# Patient Record
Sex: Female | Born: 1975
Health system: Southern US, Community
[De-identification: ages and names within clinical notes are randomized; demographics above are authoritative.]

## PROBLEM LIST (undated history)

## (undated) DIAGNOSIS — E039 Hypothyroidism, unspecified: Secondary | ICD-10-CM

## (undated) DIAGNOSIS — E079 Disorder of thyroid, unspecified: Secondary | ICD-10-CM

## (undated) HISTORY — PX: BREAST BIOPSY: SHX20

---

## 2005-08-10 ENCOUNTER — Inpatient Hospital Stay (HOSPITAL_COMMUNITY): Admission: AD | Admit: 2005-08-10 | Discharge: 2005-08-10 | Payer: Self-pay | Admitting: Obstetrics and Gynecology

## 2006-08-24 ENCOUNTER — Ambulatory Visit: Payer: Self-pay | Admitting: Family Medicine

## 2006-08-27 ENCOUNTER — Emergency Department (HOSPITAL_COMMUNITY): Admission: EM | Admit: 2006-08-27 | Discharge: 2006-08-27 | Payer: Self-pay | Admitting: Emergency Medicine

## 2006-08-27 ENCOUNTER — Ambulatory Visit (HOSPITAL_COMMUNITY): Admission: RE | Admit: 2006-08-27 | Discharge: 2006-08-27 | Payer: Self-pay | Admitting: Family Medicine

## 2006-12-07 ENCOUNTER — Ambulatory Visit: Payer: Self-pay | Admitting: Family Medicine

## 2007-01-11 ENCOUNTER — Ambulatory Visit: Payer: Self-pay | Admitting: Family Medicine

## 2007-01-18 ENCOUNTER — Ambulatory Visit: Payer: Self-pay | Admitting: Family Medicine

## 2007-01-20 ENCOUNTER — Ambulatory Visit (HOSPITAL_COMMUNITY): Admission: RE | Admit: 2007-01-20 | Discharge: 2007-01-20 | Payer: Self-pay | Admitting: Family Medicine

## 2007-01-20 ENCOUNTER — Ambulatory Visit: Payer: Self-pay | Admitting: *Deleted

## 2007-02-11 ENCOUNTER — Ambulatory Visit: Payer: Self-pay | Admitting: Family Medicine

## 2007-02-11 ENCOUNTER — Encounter (INDEPENDENT_AMBULATORY_CARE_PROVIDER_SITE_OTHER): Payer: Self-pay | Admitting: Family Medicine

## 2007-07-13 ENCOUNTER — Encounter (INDEPENDENT_AMBULATORY_CARE_PROVIDER_SITE_OTHER): Payer: Self-pay | Admitting: *Deleted

## 2007-09-13 ENCOUNTER — Ambulatory Visit: Payer: Self-pay | Admitting: Family Medicine

## 2007-10-11 ENCOUNTER — Ambulatory Visit: Payer: Self-pay | Admitting: Family Medicine

## 2007-11-14 ENCOUNTER — Ambulatory Visit: Payer: Self-pay | Admitting: Family Medicine

## 2007-11-14 LAB — CONVERTED CEMR LAB
Free T4: 0.68 ng/dL — ABNORMAL LOW (ref 0.89–1.80)
TSH: 98.738 microintl units/mL — ABNORMAL HIGH (ref 0.350–5.50)

## 2007-12-13 ENCOUNTER — Ambulatory Visit: Payer: Self-pay | Admitting: Family Medicine

## 2007-12-26 ENCOUNTER — Ambulatory Visit (HOSPITAL_COMMUNITY): Admission: RE | Admit: 2007-12-26 | Discharge: 2007-12-26 | Payer: Self-pay | Admitting: Family Medicine

## 2008-03-12 ENCOUNTER — Encounter (INDEPENDENT_AMBULATORY_CARE_PROVIDER_SITE_OTHER): Payer: Self-pay | Admitting: Family Medicine

## 2008-03-12 ENCOUNTER — Ambulatory Visit: Payer: Self-pay | Admitting: Family Medicine

## 2008-03-12 LAB — CONVERTED CEMR LAB
BUN: 22 mg/dL (ref 6–23)
Basophils Absolute: 0.1 10*3/uL (ref 0.0–0.1)
CO2: 24 meq/L (ref 19–32)
Chlamydia, DNA Probe: NEGATIVE
Creatinine, Ser: 1.06 mg/dL (ref 0.40–1.20)
Eosinophils Absolute: 0.1 10*3/uL (ref 0.0–0.7)
MCV: 93.3 fL (ref 78.0–100.0)
Monocytes Absolute: 0.4 10*3/uL (ref 0.1–1.0)
Neutro Abs: 3.6 10*3/uL (ref 1.7–7.7)
Platelets: 234 10*3/uL (ref 150–400)
Potassium: 3.9 meq/L (ref 3.5–5.3)
RDW: 14.5 % (ref 11.5–15.5)
WBC: 6.8 10*3/uL (ref 4.0–10.5)

## 2008-08-28 ENCOUNTER — Emergency Department (HOSPITAL_COMMUNITY): Admission: EM | Admit: 2008-08-28 | Discharge: 2008-08-28 | Payer: Self-pay | Admitting: Emergency Medicine

## 2008-09-06 ENCOUNTER — Ambulatory Visit: Payer: Self-pay | Admitting: Family Medicine

## 2008-09-06 LAB — CONVERTED CEMR LAB
BUN: 25 mg/dL — ABNORMAL HIGH (ref 6–23)
Calcium: 9.6 mg/dL (ref 8.4–10.5)
Chloride: 102 meq/L (ref 96–112)
Free T4: 0.28 ng/dL — ABNORMAL LOW (ref 0.89–1.80)
Potassium: 4.6 meq/L (ref 3.5–5.3)
TSH: 622.881 microintl units/mL — ABNORMAL HIGH (ref 0.350–4.50)

## 2008-11-06 ENCOUNTER — Ambulatory Visit: Payer: Self-pay | Admitting: Family Medicine

## 2008-11-06 LAB — CONVERTED CEMR LAB
T3 Uptake Ratio: 27.7 % (ref 22.5–37.0)
T4, Total: 2.7 ug/dL — ABNORMAL LOW (ref 5.0–12.5)

## 2008-11-15 ENCOUNTER — Ambulatory Visit: Payer: Self-pay | Admitting: Family Medicine

## 2008-12-27 ENCOUNTER — Ambulatory Visit: Payer: Self-pay | Admitting: Family Medicine

## 2008-12-27 LAB — CONVERTED CEMR LAB: TSH: 508.509 microintl units/mL — ABNORMAL HIGH (ref 0.350–4.50)

## 2009-01-15 ENCOUNTER — Ambulatory Visit: Payer: Self-pay | Admitting: Family Medicine

## 2009-01-22 ENCOUNTER — Ambulatory Visit (HOSPITAL_COMMUNITY): Admission: RE | Admit: 2009-01-22 | Discharge: 2009-01-22 | Payer: Self-pay | Admitting: Family Medicine

## 2009-02-26 ENCOUNTER — Ambulatory Visit: Payer: Self-pay | Admitting: Family Medicine

## 2009-02-28 ENCOUNTER — Ambulatory Visit: Payer: Self-pay | Admitting: Family Medicine

## 2009-03-01 ENCOUNTER — Ambulatory Visit (HOSPITAL_COMMUNITY): Admission: RE | Admit: 2009-03-01 | Discharge: 2009-03-01 | Payer: Self-pay | Admitting: Family Medicine

## 2009-03-07 ENCOUNTER — Inpatient Hospital Stay (HOSPITAL_COMMUNITY): Admission: AD | Admit: 2009-03-07 | Discharge: 2009-03-07 | Payer: Self-pay | Admitting: Obstetrics & Gynecology

## 2009-03-09 ENCOUNTER — Inpatient Hospital Stay (HOSPITAL_COMMUNITY): Admission: AD | Admit: 2009-03-09 | Discharge: 2009-03-09 | Payer: Self-pay | Admitting: Obstetrics & Gynecology

## 2009-03-11 ENCOUNTER — Ambulatory Visit: Payer: Self-pay | Admitting: Family Medicine

## 2009-04-04 ENCOUNTER — Ambulatory Visit: Payer: Self-pay | Admitting: Obstetrics and Gynecology

## 2009-04-05 ENCOUNTER — Encounter: Payer: Self-pay | Admitting: Obstetrics & Gynecology

## 2009-08-05 IMAGING — US US TRANSVAGINAL NON-OB
1 series · 13 of 25 positions shown · non-contrast
Comparison: 12/26/2007

CLINICAL DATA: Positive pregnancy test 3 weeks ago with heavy
bleeding 2 weeks ago.  Question retained products of conception.

TRANSABDOMINAL AND TRANSVAGINAL ULTRASOUND OF PELVIS
TECHNIQUE: Both transabdominal and transvaginal ultrasound
examinations of the pelvis were performed including evaluation of
the uterus, ovaries, adnexal regions, and pelvic cul-de-sac.

[Series 1: us transvaginal non-ob · 0.30mm/px · 13 of 59 slices shown]
[im 1/59]
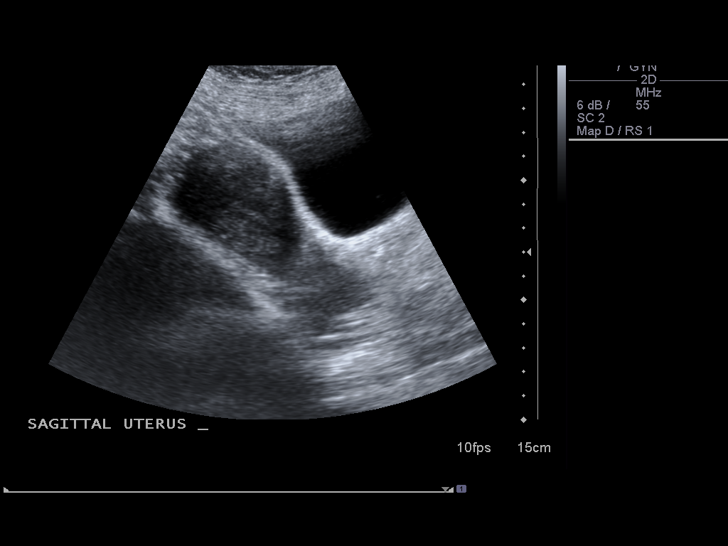
[im 5/59]
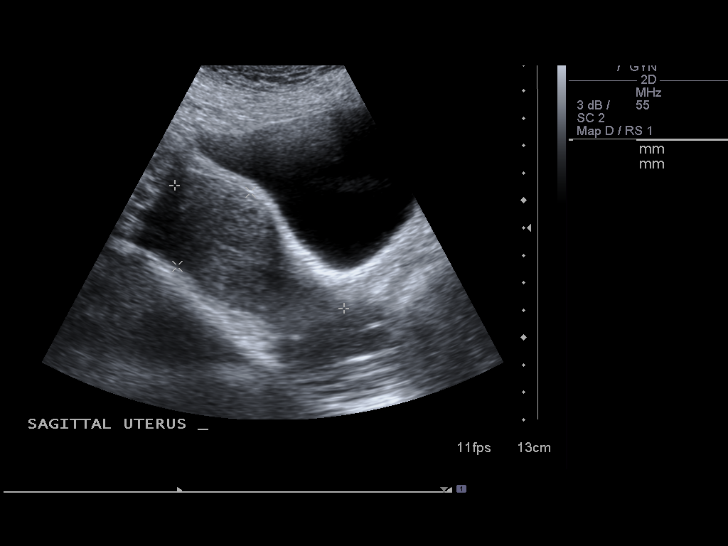
[im 10/59]
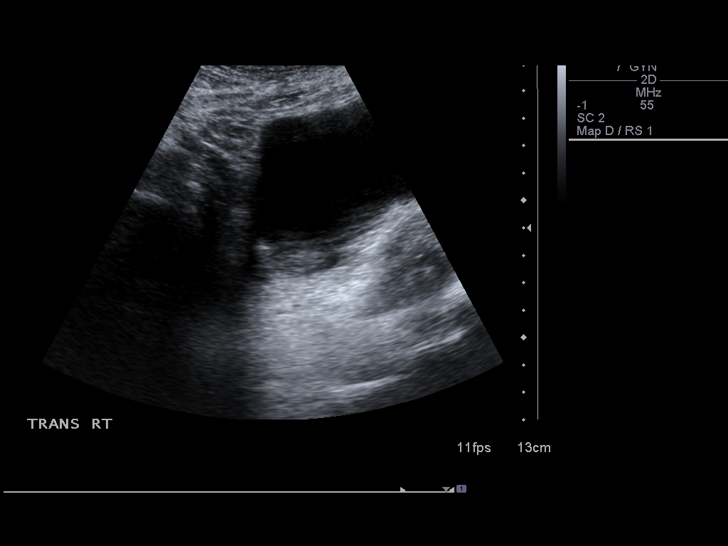
[im 15/59]
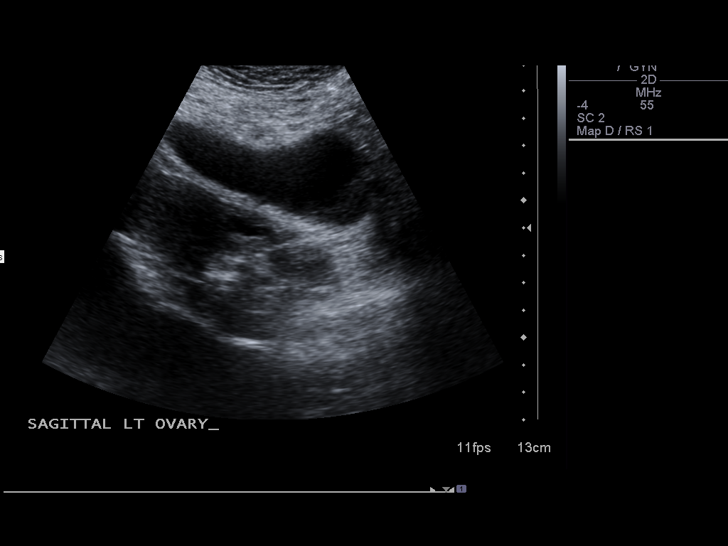
[im 20/59]
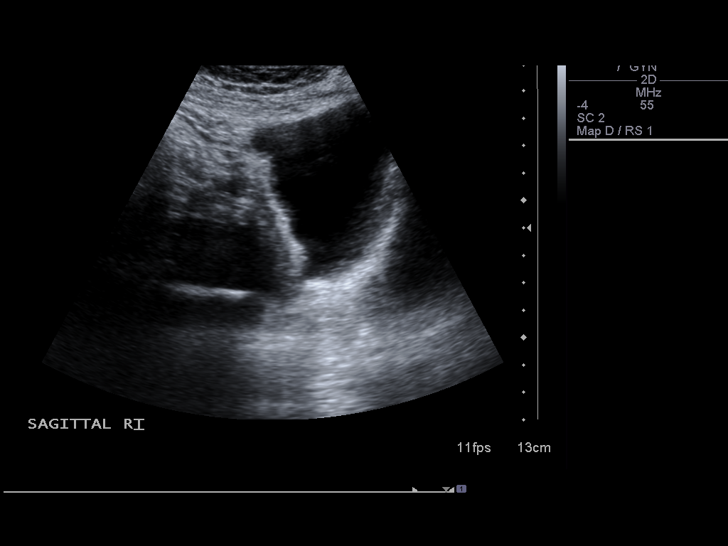
[im 25/59]
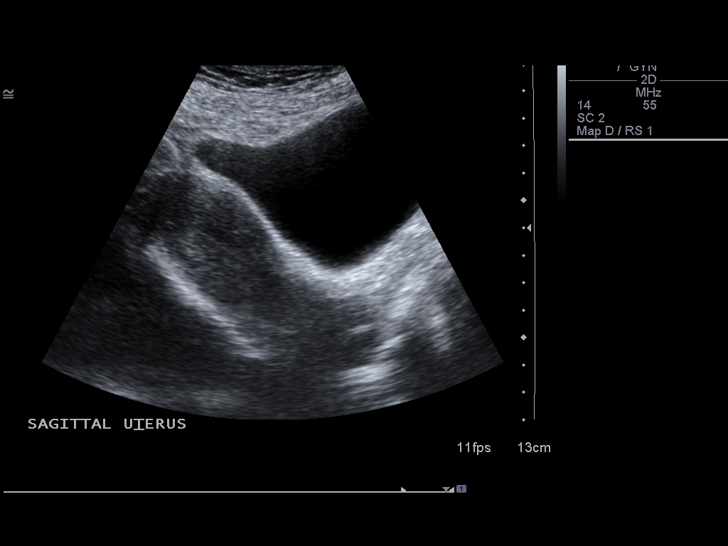
[im 30/59]
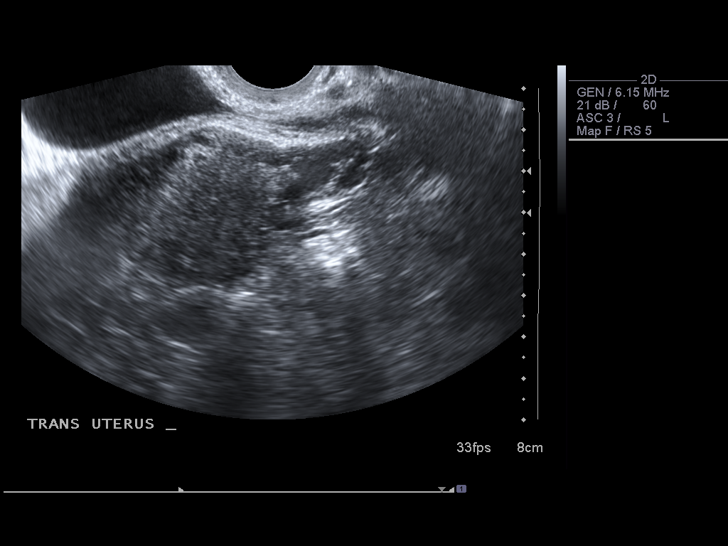
[im 34/59]
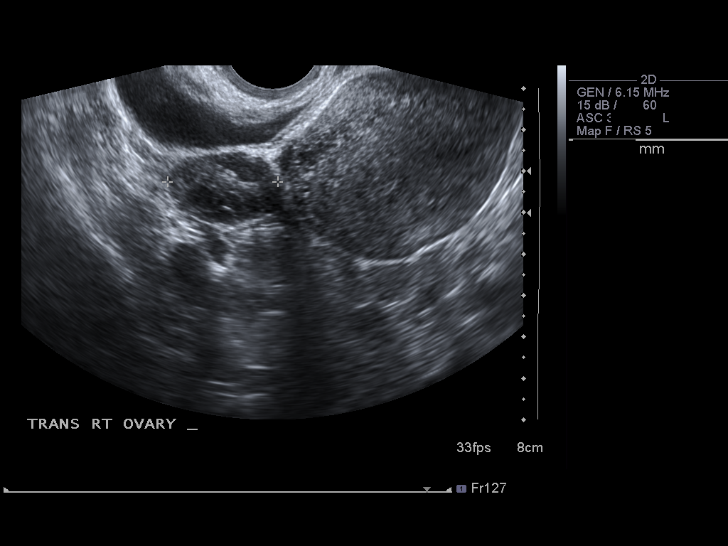
[im 39/59]
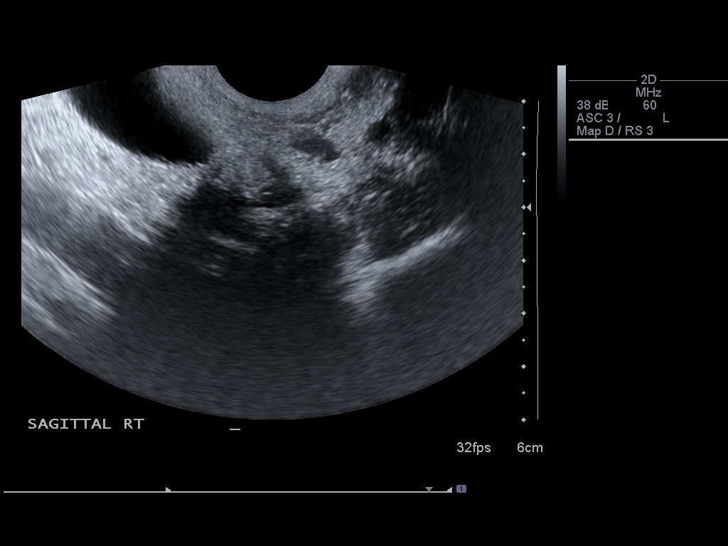
[im 44/59]
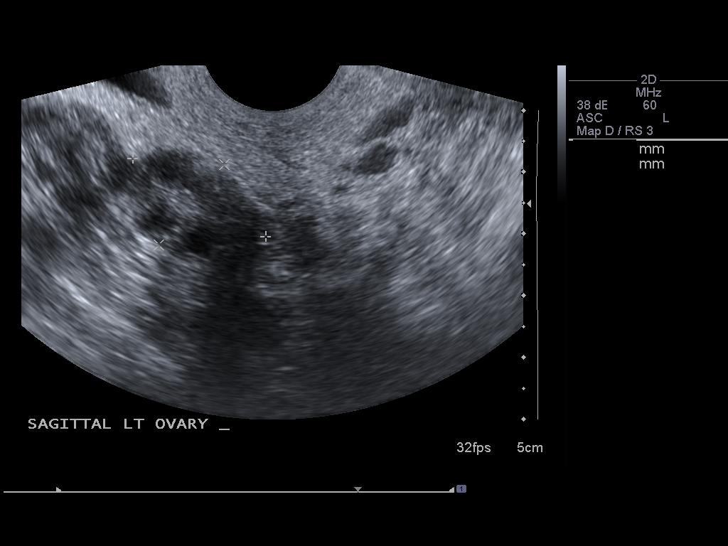
[im 49/59]
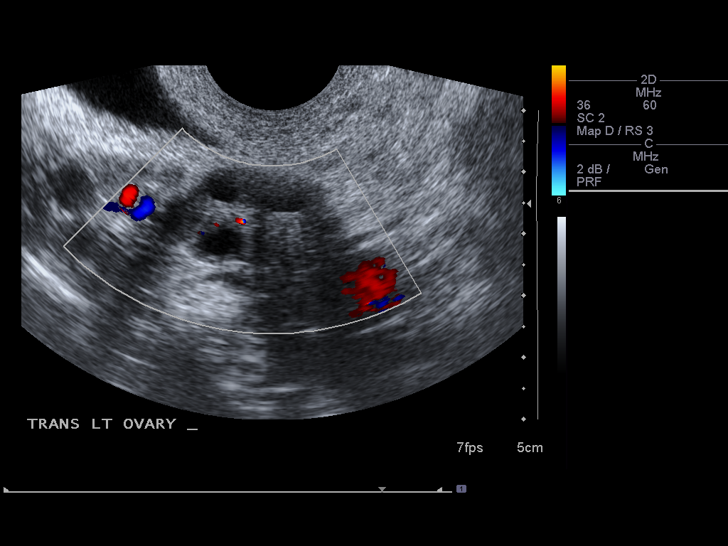
[im 54/59]
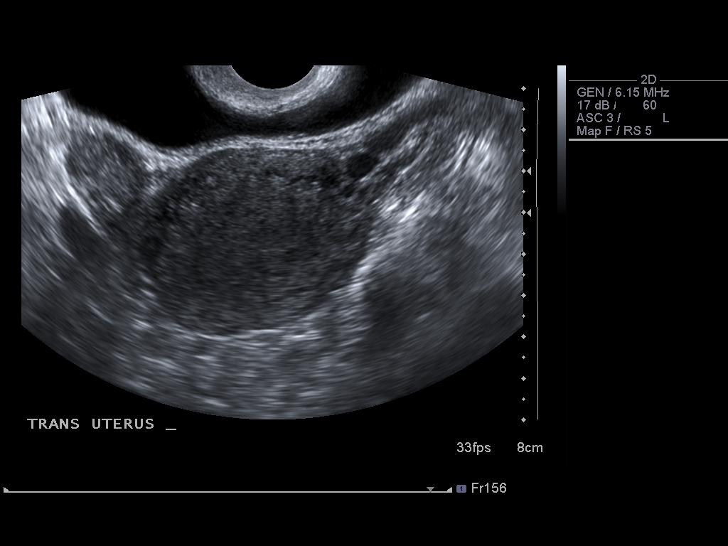
[im 59/59]
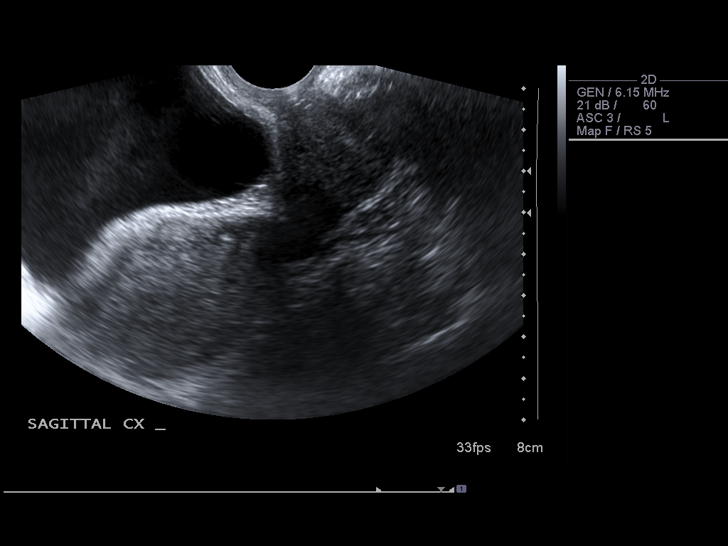

[13 of 25 positions shown; findings below may reference images not displayed]

FINDINGS: Uterus:  The uterus has a sagittal length of 7.6 cm, an AP width of
3.8 cm and a transverse width of 5.7 cm.  A homogeneous uterine
myometrium is seen.

Endometrium:  The endometrial stripe is thin and echogenic with an
AP width of 3.3 mm.  No areas of focal thickening or inhomogeneity
are seen to suggest retained products of conception.  No
endometrial thickening in general is noted to suggest that there
may be a persistent sonographically silent early IUP with
correlation with quantitative beta HCG would be necessary for this
determination.

Right Ovary:  Normal appearance measuring 2.8 x 1.6 x 2.7 cm

Left Ovary:  Normal appearance measuring 2.5 x 1.7 x 2.8 cm

Other Findings:  No pelvic fluid or separate adnexal masses are
noted.
IMPRESSION: Normal pelvic ultrasound with a thin endometrial lining.  No
sonographic signs to suggest retained products of conception are
seen.

## 2009-10-04 ENCOUNTER — Ambulatory Visit: Payer: Self-pay | Admitting: Family Medicine

## 2009-10-04 LAB — CONVERTED CEMR LAB
AST: 14 units/L (ref 0–37)
Alkaline Phosphatase: 42 units/L (ref 39–117)
BUN: 18 mg/dL (ref 6–23)
Calcium: 9 mg/dL (ref 8.4–10.5)
Cholesterol: 159 mg/dL (ref 0–200)
Creatinine, Ser: 0.66 mg/dL (ref 0.40–1.20)
Potassium: 4.8 meq/L (ref 3.5–5.3)
Sodium: 140 meq/L (ref 135–145)
TSH: 79.91 microintl units/mL — ABNORMAL HIGH (ref 0.350–4.500)
Total CHOL/HDL Ratio: 2.7
Total Protein: 7.2 g/dL (ref 6.0–8.3)

## 2009-10-14 ENCOUNTER — Ambulatory Visit (HOSPITAL_COMMUNITY): Admission: RE | Admit: 2009-10-14 | Discharge: 2009-10-14 | Payer: Self-pay | Admitting: Family Medicine

## 2010-03-13 ENCOUNTER — Ambulatory Visit: Payer: Self-pay | Admitting: Family Medicine

## 2010-09-05 ENCOUNTER — Encounter (INDEPENDENT_AMBULATORY_CARE_PROVIDER_SITE_OTHER): Payer: Self-pay | Admitting: Family Medicine

## 2010-09-05 LAB — CONVERTED CEMR LAB: CA 125: 17.9 units/mL (ref 0.0–30.2)

## 2010-11-16 ENCOUNTER — Encounter: Payer: Self-pay | Admitting: Family Medicine

## 2011-02-03 LAB — CBC
MCHC: 34.7 g/dL (ref 30.0–36.0)
MCV: 94.3 fL (ref 78.0–100.0)
RBC: 3.67 MIL/uL — ABNORMAL LOW (ref 3.87–5.11)
RDW: 13.4 % (ref 11.5–15.5)

## 2011-02-03 LAB — URINALYSIS, ROUTINE W REFLEX MICROSCOPIC
Bilirubin Urine: NEGATIVE
Specific Gravity, Urine: 1.01 (ref 1.005–1.030)
pH: 6 (ref 5.0–8.0)

## 2011-02-03 LAB — HCG, QUANTITATIVE, PREGNANCY: hCG, Beta Chain, Quant, S: 345 m[IU]/mL — ABNORMAL HIGH (ref <5)

## 2011-02-03 LAB — URINE MICROSCOPIC-ADD ON

## 2011-02-03 LAB — GC/CHLAMYDIA PROBE AMP, GENITAL: GC Probe Amp, Genital: NEGATIVE

## 2011-02-03 LAB — WET PREP, GENITAL

## 2011-03-10 NOTE — Group Therapy Note (Signed)
Brittney Pena, Brittney Pena NO.:  [id number]   MEDICAL RECORD NO.:  [id number]          PATIENT TYPE:  WOC   LOCATION:  [facility]                   FACILITY:  [facility]   PHYSICIAN:  Argentina Donovan, MD        DATE OF BIRTH:  June 25, 1976   DATE OF SERVICE:  04/04/2009                                  CLINIC NOTE   CHIEF COMPLAINT:  Followup of miscarriage at the [facility] 2 months prior.   HISTORY OF PRESENT ILLNESS:  This is a 35 year old G4, P2-0-2-2, who  presents for followup of missed abortion. She was seen at [facility] on Mar 07, 2009 and diagnosed with missed abortion.  Ultrasound at that time showed irregular uterine gestational sac. Beta  HCG level was 348. Two days later, followup beta HCG level had gone up  to 374. The patient declined Cytotec at that visit and was scheduled to  followup at [facility]. She states that she had been bleeding a small  amounts for a month and finished 2 weeks ago. She denies any abdominal  pain in the recent past. Denies any fever, chills, nausea, or vomiting.  She states she would desire a future pregnancy.   ALLERGIES:  NO KNOWN DRUG ALLERGIES.   MEDICATIONS:  1. Paxil.  2. Synthroid (thyroid medication.)   PAST SURGICAL HISTORY:  1. Cesarean section in 1999.   PAST MEDICAL HISTORY:  1. Depression.  2. Thyroid abnormality (might be hypothyroidism, for which I presume      she takes Synthroid.)   PHYSICAL EXAMINATION:  VITAL SIGNS:  Temperature 97.7, pulse 66,  respiratory rate 12, blood pressure 90/62. Weight 146.9 pounds.  GENERAL:  No acute distress. A Hispanic female, Spanish speaking only.  GENITOURINARY:  Deferred. There are no complaints today.   ASSESSMENT/PLAN:  This is a 35 year old G4, P2-0-2-2, who presents for  followup of missed abortion about a month ago.  1. MISSED ABORTION:  We will check a beta HCG quantity level today and      discuss with patient, options including Cytotec versus just logical      waiting. If beta HCG level is reassuring, she will likely need to      further followup. If beta HCG level is elevated or there are other      concerns, we will have patient come back for close followup and      possible weekly HCG checks versus Cytotec use.  2. CONTINUED DESIRE FERTILITY:  The patient states that she would like      to get pregnant again. Prenatal vitamin prescription given to      patient today. Also discussed trying to abstain from becoming      pregnant for the next 2 cycles and then she may resume as previous.  3. HYPOTHYROIDISM:  The patient states that her thyroid level was      checked by primary care physician a month ago and it was within      normal limits.  4. WELL WOMAN:  The patient states her last pap smear was 2 months ago  and she has had normal pap smears in the past.     ______________________________  Maylon Cos, CNM    ______________________________  Argentina Donovan, MD    SS/MEDQ  D:  04/04/2009  T:  04/04/2009  Job:  [id number]

## 2011-04-20 ENCOUNTER — Other Ambulatory Visit: Payer: Self-pay | Admitting: Geriatric Medicine

## 2011-04-20 DIAGNOSIS — N6452 Nipple discharge: Secondary | ICD-10-CM

## 2011-07-29 LAB — URINALYSIS, ROUTINE W REFLEX MICROSCOPIC
Glucose, UA: NEGATIVE
Ketones, ur: NEGATIVE
Leukocytes, UA: NEGATIVE
pH: 7.5

## 2011-07-29 LAB — URINE MICROSCOPIC-ADD ON

## 2011-07-29 LAB — DIFFERENTIAL
Basophils Relative: 1
Eosinophils Absolute: 0.1
Neutrophils Relative %: 54

## 2011-07-29 LAB — CBC
Hemoglobin: 12.2
MCHC: 34.4
RBC: 3.74 — ABNORMAL LOW

## 2011-07-29 LAB — COMPREHENSIVE METABOLIC PANEL
ALT: 32
Alkaline Phosphatase: 39
CO2: 28
GFR calc non Af Amer: >60
Glucose, Bld: 77
Potassium: 3.6
Sodium: 141

## 2011-07-29 LAB — LIPASE, BLOOD: Lipase: 30

## 2012-02-22 ENCOUNTER — Encounter (HOSPITAL_COMMUNITY): Payer: Self-pay | Admitting: Emergency Medicine

## 2012-02-22 ENCOUNTER — Emergency Department (HOSPITAL_COMMUNITY)
Admission: EM | Admit: 2012-02-22 | Discharge: 2012-02-22 | Discharge status: Against Medical Advice | Payer: Self-pay | Attending: Emergency Medicine | Admitting: Emergency Medicine

## 2012-02-22 DIAGNOSIS — Z0389 Encounter for observation for other suspected diseases and conditions ruled out: Secondary | ICD-10-CM | POA: Insufficient documentation

## 2012-02-22 HISTORY — DX: Disorder of thyroid, unspecified: E07.9

## 2012-02-22 NOTE — ED Notes (Signed)
Pt reports pain in right tooth, right sided numbness in facial/ sinus area;  Pt having headache as well.

## 2012-02-22 NOTE — ED Notes (Signed)
Pt arrived with R facial pain and numbness and R arm numbness.  Seen by NP Inocencio Homes and code stroke ruled out.

## 2014-07-26 ENCOUNTER — Ambulatory Visit (INDEPENDENT_AMBULATORY_CARE_PROVIDER_SITE_OTHER): Payer: No Typology Code available for payment source | Admitting: Emergency Medicine

## 2014-07-26 VITALS — BP 94/70 | HR 76 | Temp 97.7°F | Resp 16

## 2014-07-26 DIAGNOSIS — T2692XA Corrosion of left eye and adnexa, part unspecified, initial encounter: Secondary | ICD-10-CM

## 2014-07-26 MED ORDER — ACETAMINOPHEN-CODEINE #3 300-30 MG PO TABS: 1.0000 | ORAL_TABLET | ORAL | Status: DC | PRN

## 2014-07-26 MED ORDER — OFLOXACIN 0.3 % OP SOLN: 1.0000 [drp] | OPHTHALMIC | Status: DC

## 2014-07-26 NOTE — Progress Notes (Signed)
[facility] [address], [place] [address] [phone]  Date:  07/26/2014   Name:  Brittney Pena   DOB:  1976-08-01   MRN:  [id number]  PCP:  No primary provider on file.    Chief Complaint: No chief complaint on file.   History of Present Illness:  Brittney Pena is a 38 y.o. very pleasant female patient who presents with the following:  Patient splashed clorox in her left eye.  Washed with water.  Still has pain in left eye Spasm of lids. Denies other complaint or health concern today.   There are no active problems to display for this patient.   Past Medical History  Diagnosis Date  . Thyroid disease     No past surgical history on file.  History  Substance Use Topics  . Smoking status: Not on file  . Smokeless tobacco: Not on file  . Alcohol Use:     No family history on file.  No Known Allergies  Medication list has been reviewed and updated.  Current Outpatient Prescriptions on File Prior to Visit  Medication Sig Dispense Refill  . levothyroxine (SYNTHROID, LEVOTHROID) 100 MCG tablet Take 100 mcg by mouth daily.       No current facility-administered medications on file prior to visit.    Review of Systems:  As per HPI, otherwise negative.    Physical Examination: There were no vitals filed for this visit. There were no vitals filed for this visit. There is no height or weight on file to calculate BMI. Ideal Body Weight:     GEN: WDWN, moderate distress, Non-toxic, Alert & Oriented x 3 HEENT: Atraumatic, Normocephalic.  Ears and Nose: No external deformity. EXTR: No clubbing/cyanosis/edema NEURO: Normal gait.  PSYCH: Normally interactive. Conversant. Not depressed or anxious appearing.  Calm demeanor.  Left eye sclera injected and red.  No FB.  PRRERLA EOMI Negative fluorescein  Assessment and Plan: Chemical conjunctivitis Irrigated 2 liters NSS Final pH:  7.0 tyl #3 oflox Ophthalmology    Signed,   Ellison Carwin, MD

## 2014-07-26 NOTE — Addendum Note (Signed)
Addended by: Jacqualin Combes on: 07/26/2014 11:11 AM   Modules accepted: Level of Service

## 2014-07-26 NOTE — Patient Instructions (Signed)
Conjuntivitis por sustancias qumicas (Chemical Conjunctivitis) El profesional que lo asiste le ha diagnosticado que usted padece conjuntivitis producida por sustancias qumicas. Se trata de una irritacin de la zona interna del prpado y de la parte blanca del ojo. Las causas de la conjuntivitis pueden ser una infeccin, [place] o irritacin qumica. En su caso, el origen es una irritacin producida por un producto qumico. Casi siempre se asocia con lagrimeo, sensibilidad a la luz, sensacin (percepcin) de Geneticist, molecular ojo, hinchazn de los prpados, y con Glass blower/designer intenso. A pesar del dolor, se autolimita y podr mejorar en veinticuatro horas.  INSTRUCCIONES PARA EL CUIDADO DOMICILIARIO  Para aliviar la molestia, aplique una compresa limpia y fra en el ojo durante 10 a 20 minutos, 3 a 4 veces por da.  No se frote los ojos.  Limpie suavemente todas las secreciones del ojo con un tis hmedo.  Lvese las manos con frecuencia con jabn y use toallas de papel para secarse.  Puede utilizar anteojos para el sol si la [employer].  No utilice maquillaje.  No utilice lentes de contacto hasta que la irritacin haya desaparecido.  No opere maquinarias ni conduzca si su visin es borrosa.  Tome los [employer] como le indic el profesional que lo asiste. Las lgrimas artificiales pueden causar molestias.  Evite las sustancias qumicas o los ambientes que le causaron el problema. Use proteccin en los ojos siempre que sea necesario. SOLICITE ATENCIN MDICA SI:  Si los ojos an estn irritados (inflamados) luego de 3 das de haber comenzado el tratamiento.  El [employer] ojos Wanda.  Usted presenta una secrecin que proviene de un ojo.  Sus prpados estn pegados por la [employer]  Tiene una mayor sensibilidad a Naval architect.  La temperatura oral se eleva sin motivo por encima de 38,9 C (13 F) o segn le indique el profesional que lo asiste.  Comienza a Psychiatrist.  Tiene algn problema que pueda relacionarse con el medicamento que est tomando. SOLICITE ATENCIN MDICA DE INMEDIATO SI:  La visin es empeora.  Presenta dolor intenso en el ojo. EST SEGURO QUE:   Comprende las instrucciones para el alta mdica.  Controlar su enfermedad.  Solicitar atencin mdica de inmediato segn las indicaciones. Document Released: 07/22/2005 Document Revised: 01/04/2012 [facility] Patient Information 2015 [place]. This information is not intended to replace advice given to you by your health care provider. Make sure you discuss any questions you have with your health care provider.

## 2015-03-16 ENCOUNTER — Ambulatory Visit (INDEPENDENT_AMBULATORY_CARE_PROVIDER_SITE_OTHER): Payer: 59 | Admitting: Family Medicine

## 2015-03-16 VITALS — BP 90/60 | HR 69 | Temp 98.6°F | Resp 16 | Ht 65.0 in | Wt 144.1 lb

## 2015-03-16 DIAGNOSIS — M542 Cervicalgia: Secondary | ICD-10-CM

## 2015-03-16 DIAGNOSIS — G8929 Other chronic pain: Secondary | ICD-10-CM

## 2015-03-16 DIAGNOSIS — N920 Excessive and frequent menstruation with regular cycle: Secondary | ICD-10-CM

## 2015-03-16 DIAGNOSIS — M545 Low back pain, unspecified: Secondary | ICD-10-CM

## 2015-03-16 MED ORDER — MELOXICAM 7.5 MG PO TABS: 7.5000 mg | ORAL_TABLET | Freq: Every day | ORAL | Status: DC

## 2015-03-16 NOTE — Progress Notes (Addendum)
° °  Subjective:  This chart was scribed for Brittney Haber, MD by Leandra Kern, Medical Scribe. This patient was seen in Room 8 and the patient's care was started at 2:33 PM.   Patient ID: Brittney Pena, female    DOB: 1976/10/16, 39 y.o.   MRN: [id number]  HPI HPI Comments: Brittney Pena is a 39 y.o. female from [place] presents to [facility] complaining of neck and back pain.  Pt reports feeling tightness in the neck, gradual onset of 6 months. Patient also notes having lumbar back pain.  She states that she feels nauseated when eating, however, without vomiting. Patient denies having coughs.  Patient's last menstrual period was from 05/12 -05/18 and it was  accompanied with passing clots, it was more painful than normal. Patient has 2 children, one caesarian, and one natural.  Patient's last Pap smear was a year ago.   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   Review of Systems  Respiratory: Negative for cough.   Gastrointestinal: Positive for nausea. Negative for vomiting.  Genitourinary: Positive for menstrual problem.  Musculoskeletal: Positive for back pain, neck pain and neck stiffness.      Objective:   Physical Exam  Constitutional: She is oriented to person, place, and time. She appears well-developed and well-nourished. No distress.  HENT:  Head: Normocephalic and atraumatic.  Mouth/Throat: Oropharynx is clear and moist. No oropharyngeal exudate.  Eyes: Pupils are equal, round, and reactive to light.  Neck: Neck supple.  Cardiovascular: Normal rate.   Pulmonary/Chest: Effort normal.  Abdominal: Soft. She exhibits no distension and no mass. There is tenderness. There is no rebound and no guarding.  Bilateral lower abdominal tenderness with deep palpation, no guarding or rebound  Genitourinary: Vagina normal.  Tender uterus on bimanual exam Normal external genitalia him a normal vagina, normal cervix  Musculoskeletal: She exhibits no edema.   Neurological: She is alert and oriented to person, place, and time. No cranial nerve deficit.  Skin: Skin is warm and dry. No rash noted.  Psychiatric: She has a normal mood and affect. Her behavior is normal.  Nursing note and vitals reviewed.     Assessment & Plan:   This chart was scribed in my presence and reviewed by me personally.    ICD-9-CM ICD-10-CM   1. Neck pain 723.1 M54.2 meloxicam (MOBIC) 7.5 MG tablet  2. Menorrhagia with regular cycle 626.2 N92.0 Ambulatory referral to Gynecology  3. Chronic low back pain 724.2 M54.5    338.29 G89.29    Because of the tenderness of the uterus which is mildly enlarged, I think it's important to have the patient follow-up with gynecology. What appears to be fibroids could be contributing to the lower back pain.  I don't see any indication that the neck pain is anything serious. She has good range of motion, reflexes are normal, and her strength and muscle mass are normal in the upper extremities. The fact that has gone on for 6 months without change also speaks to the benign nature. I've given her some meloxicam to help deal with the chronic pain and muscle fatigue that she endorse with her job.  Signed, Brittney Haber, MD

## 2015-03-16 NOTE — Patient Instructions (Signed)
Fibromas (Fibroids) Los fibromas son bultos (tumores) que pueden Administrator del cuerpo de Musician. Estos tumores no son cancerosos. Pueden variar en tamao, peso y lugar en el que crecen. CUIDADOS EN EL HOGAR  No tome aspirina.  Anote el nmero de apsitos o tampones que [place] durante el perodo. Infrmelo a su mdico. Esto puede ayudar a determinar el mejor tratamiento para usted. SOLICITE AYUDA DE INMEDIATO SI:  Siente dolor en la zona inferior del vientre (abdomen) y no se alivia con analgsicos.  Tiene clicos que no se calman con medicamentos  Aumenta el sangrado entre perodos o durante el mismo.  Sufre mareos o se desvanece (se desmaya).  El dolor en el vientre [place]. ASEGRESE DE QUE:  Comprende estas instrucciones.  Controlar su enfermedad.  Solicitar ayuda de inmediato si no mejora o empeora. Document Released: 01/27/2011 Document Revised: 01/04/2012 [facility] Patient Information 2015 [place]. This information is not intended to replace advice given to you by your health care provider. Make sure you discuss any questions you have with your health care provider.

## 2015-04-11 ENCOUNTER — Other Ambulatory Visit (HOSPITAL_COMMUNITY)
Admission: RE | Admit: 2015-04-11 | Discharge: 2015-04-11 | Disposition: A | Discharge status: Home / Self Care | Payer: 59 | Source: Ambulatory Visit | Attending: Gynecology | Admitting: Gynecology

## 2015-04-11 ENCOUNTER — Encounter: Payer: Self-pay | Admitting: Gynecology

## 2015-04-11 ENCOUNTER — Ambulatory Visit (INDEPENDENT_AMBULATORY_CARE_PROVIDER_SITE_OTHER): Payer: 59 | Admitting: Gynecology

## 2015-04-11 VITALS — BP 118/76 | Ht 65.0 in | Wt 142.6 lb

## 2015-04-11 DIAGNOSIS — Z01419 Encounter for gynecological examination (general) (routine) without abnormal findings: Secondary | ICD-10-CM | POA: Insufficient documentation

## 2015-04-11 DIAGNOSIS — N938 Other specified abnormal uterine and vaginal bleeding: Secondary | ICD-10-CM | POA: Insufficient documentation

## 2015-04-11 DIAGNOSIS — G44009 Cluster headache syndrome, unspecified, not intractable: Secondary | ICD-10-CM

## 2015-04-11 DIAGNOSIS — Z1151 Encounter for screening for human papillomavirus (HPV): Secondary | ICD-10-CM | POA: Diagnosis present

## 2015-04-11 DIAGNOSIS — E038 Other specified hypothyroidism: Secondary | ICD-10-CM | POA: Diagnosis not present

## 2015-04-11 DIAGNOSIS — O926 Galactorrhea: Secondary | ICD-10-CM | POA: Diagnosis not present

## 2015-04-11 DIAGNOSIS — Z124 Encounter for screening for malignant neoplasm of cervix: Secondary | ICD-10-CM

## 2015-04-11 DIAGNOSIS — N643 Galactorrhea not associated with childbirth: Secondary | ICD-10-CM | POA: Insufficient documentation

## 2015-04-11 NOTE — Addendum Note (Signed)
Addended by: Alen Blew on: 04/11/2015 12:34 PM   Modules accepted: Orders

## 2015-04-11 NOTE — Patient Instructions (Signed)
Levonorgestrel intrauterine device (IUD) Qu es este medicamento? El LEVONORGESTREL (DIU) es un dispositivo anticonceptivo (control de natalidad). El dispositivo se coloca dentro del tero por un profesional de la salud. Se utiliza para Therapist, occupational y tambin se puede Risk manager para tratar el sangrado abundante que ocurre durante su perodo. Dependiendo del dispositivo, se puede utilizar por 3 a 5 aos. Este medicamento puede ser utilizado para otros usos; si tiene alguna pregunta consulte con su proveedor de atencin mdica o con su farmacutico. MARCAS COMERCIALES DISPONIBLES: Jackson Latino, Hubbard Hartshorn le debo informar a mi profesional de la salud antes de tomar este medicamento? Necesita saber si usted presenta alguno de los siguientes problemas o situaciones: -exmen de Papanicolaou anormal -cncer de mama, cuello del tero o tero -diabetes -endometritis -si tiene una infeccin plvica o genital actual o en el pasado -tiene ms de una pareja sexual o si su pareja tiene ms de una pareja -enfermedad cardiaca -antecedente de embarazo tubrico o ectpico -problemas del sistema inmunolgico -DIU colocado -enfermedad heptica o tumor del hgado -problemas con la coagulacin o si toma diluyentes sanguneos -[place] medicamentos intravenoso -forma inusual del tero -sangrado vaginal que no tiene explicacin -una reaccin alrgica o inusual al levonorgestrel, a otras hormonas, a la silicona o polietilenos, a otros medicamentos, alimentos, colorantes o conservantes -si est embarazada o buscando quedar embarazada -si est amamantando a un beb Cmo debo utilizar este medicamento? Un profesional de Estate agent este dispositivo en el tero. Hable con su pediatra para informarse acerca del uso de este medicamento en nios. Puede requerir atencin especial. Sobredosis: Pngase en contacto inmediatamente con un centro toxicolgico o una sala de urgencia si usted cree que haya tomado  demasiado medicamento. ATENCIN: [employer] es solo para usted. No comparta este medicamento con nadie. Qu sucede si me olvido de una dosis? No se aplica en este caso. Qu puede interactuar con este medicamento? No tome esta medicina con ninguno de los siguientes medicamentos: -amprenavir -bosentano -fosamprenavir Esta medicina tambin puede interactuar con los siguientes medicamentos: -aprepitant -barbitricos para producir el sueo o para el tratamiento de convulsiones -bexaroteno -griseofulvina -medicamentos para tratar los convulsiones, tales como [place], [place], [place], [place], [place], topiramato -modafinilo -pioglitazona -rifabutina -rifampicina -rifapentina -algunos medicamentos para tratar el virus VIH, tales como atazanavir, indinavir, lopinavir, nelfinavir, tipranavir, ritonavir -hierba de [place] -warfarina Puede ser que esta lista no menciona todas las posibles interacciones. Informe a su profesional de [employer]	[place] de [employer] productos a base de hierbas, medicamentos de [place] o suplementos nutritivos que est tomando. Si usted fuma, consume bebidas alcohlicas o si utiliza drogas ilegales, indqueselo tambin a su profesional de [employer]	[place]. Algunas sustancias pueden interactuar con su medicamento. A qu debo estar atento al usar [employer]? Visite a su mdico o a su profesional de la salud para chequear su evolucin peridicamente. Visite a su mdico si usted o su pareja tiene relaciones sexuales con [employer], se vuelve VIH positivo o contrae una enfermedad de transmisin sexual. Este medicamento no la protege de la infeccin por VIH (SIDA) ni de ninguna otra enfermedad de transmisin sexual. Puede controlar la ubicacin del DIU usted misma palpando con sus dedos limpios los hilos en la parte anterior de la vagina. No tire de los hilos. Es un buen hbito controlar la ubicacin del dispositivo despus de cada perodo menstrual. Si  no slo siente los hilos sino que adems siente otra parte ms del DIU o si no puede sentir los hilos, consulte a Visual merchandiser  mdico inmediatamente. El DIU puede salirse por s solo. Puede quedar embarazada si el dispositivo se sale de Chief of Staff. Utilice un mtodo anticonceptivo adicional, como preservativos, y consulte a su proveedor de atencin mdica s observa que el DIU se sali de Chief of Staff. La utilizacin de tampones no cambia la posicin del DIU y no hay inconvenientes en usarlos durante su perodo. Qu efectos secundarios puedo tener al [employer] este medicamento? Efectos secundarios que debe informar a su mdico o a Barrister's clerk de la salud tan pronto como sea posible: -Chief of Staff como erupcin cutnea, picazn o urticarias, hinchazn de la cara, labios o lengua -fiebre, sntomas gripales -llagas genitales -alta presin sangunea -ausencia de un perodo menstrual durante 6 semanas mientras lo utiliza -Social research officer, government, Occupational hygienist en las piernas -dolor o sensibilidad del plvico -dolor de cabeza repentino o severo -signos de Media planner -calambres estomacales -falta de aliento repentina -problemas de coordinacin, del habla, al caminar -sangrado, flujo vaginal inusual -color amarillento de los ojos o la piel Efectos secundarios que, por lo general, no requieren atencin mdica (debe informarlos a su mdico o a su profesional de la salud si persisten o si son molestos): -acn -dolor de pecho -cambios en el deseo sexual o capacidad -cambios de peso -calambres, Tree surgeon o sensacin de [employer] se introduce el dispositivo -dolor de cabeza -sangrado menstruales irregulares en los primeros 3 a 6 meses de usar -nuseas Puede ser que esta lista no menciona todos los posibles efectos secundarios. Comunquese a su mdico por asesoramiento mdico [employer]. Usted puede informar los efectos secundarios a la FDA por telfono al 1-800-FDA-1088. Dnde debo guardar mi  medicina? No se aplica en este caso. ATENCIN: Este folleto es un resumen. Puede ser que no cubra toda la posible informacin. Si usted tiene preguntas acerca de esta medicina, consulte con su mdico, su farmacutico o su profesional de Technical sales engineer.  2015, Elsevier/Gold Standard. (2011-12-01 16:57:41) Ecografa transvaginal (Transvaginal Ultrasound) La ecografa transvaginal es una ecografa plvica en la que se utiliza una probeta metlica que se coloca en la vagina, para observar los rganos femeninos. El ecgrafo enva ondas sonoras desde un transductor (sonda). Estas ondas sonoras chocan contra las estructuras del cuerpo (como un eco) y crean Proofreader. La imagen se observa en un monitor. Se denomina transvaginal debido a que la sonda se inserta dentro de la vagina. Puede haber una pequea molestia por la introduccin de la sonda. Esta prueba tambin puede realizarse [employer]. La ecografa endovaginal es otro nombre para la ecografa transvaginal. En una ecografa transabdominal, la sonda se coloca en la parte externa del abdomen. Este mtodo no ofrece imgenes tan buenas como la tcnica transvaginal. La ecogafa transvaginal se utiliza para observar alteraciones en el tracto genital femenino. Entre ellos se incluyen:  Problemas de infertilidad.  Malformaciones congnitas (defecto de nacimiento) del tero y los ovarios.  Tumores en el tero.  Hemorragias anormales.  Tumores y quistes de ovario.  Abscesos (tejidos inflamados y pus) en la pelvis.  Dolor abdominal o plvico sin causa aparente.  Infecciones plvicas. DURANTE EL EMBARAZO, SE UTILIZA PAR OBSERVAR:  Embarazos normales.  Un embarazo ectpico (embarazo fuera del tero).  Latidos cardacos fetales.  Anormalidades de la pelvis que no se observan bien con la ecografa transabdominal.  Sospecha de gemelos o embarazo mltiple.  Aborto inminente.  Problemas en el cuello del tero (cuello incompetente, no  permanece cerrado para contener al beb).  Cuando se realiza una amniocentesis (se retira lquido de  la bolsa amnitica, para ser [place]).  Al buscar anormalidades en el beb.  Para controlar el crecimiento, el desarrollo y la edad del feto.  Para medir la cantidad de lquido en el saco amnitico.  Cuando se realiza una versin externa del beb (se lo mueve a Programmer, applications).  Evaluar al beb en embarazos de alto riesgo (perfil biofsico).  Si se sospecha el deceso del beb (muerte). En algunos casos, se utiliza un mtodo especial denominado ecografa con infusin salina, para una observacin ms precisa del tero. Se inyecta solucin salina (agua con sal) dentro del tero en pacientes no embarazadas para observar mejor su interior. Este mtodo no se Designer, fashion/clothing. La probeta tambin puede usarse para obtener biopsias de Educational psychologist, para drenar lquido de quistes de ovario y para Designer, jewellery un DIU (dispositivo intrauterino para el control de la natalidad) que no pueda [place]. PREPARACIN PARA LA PRUEBA La ecografa transvaginal se realiza con la vejiga vaca. La ecografa transabdominal se realiza con la vejiga llena. Podrn solicitarle que beba varios vasos de agua antes del examen. En algunos casos se realiza una ecografa transabdominal antes de la ecografa transvaginal para obervar los rganos del abdomen. PROCEDIMIENTO  Deber acostarse en una cama, con las rodillas dobladas y los pies en los estribos. La probeta se cubre con un condn. Dentro de la vagina y en la probeta se aplica un lubricante estril. El lubricante ayuda a transmitir las ondas sonoras y Fish farm manager la irritacin de la vagina. El mdico mover la sonda en el interior de la cavidad vaginal para escanear las estructuras plvicas. Un examen normal mostrar una pelvis normal y contenidos normales en su interior. Una prueba anormal mostrar anormalidades en la pelvis, la placenta o el beb. LAS CAUSAS DE  UN RESULTADO ANORMAL PUEDEN SER:  Crecimientos o tumores en:  El tero.  Los ovarios.  La vagina.  Otras estructuras plvicas.  Crecimientos no cancerosos en el tero y los ovarios.  El ovario se retuerce y se corta el suministro de Carma [place]air (torsin [place]).  Las reas de infeccin incluyen:  Enfermedad inflamatoria plvica.  Un absceso en la pelvis.  Ubicacin de un DIU. LOS PROBLEMAS QUE PUEDEN HALLARSE EN UNA MUJER EMBARAZADA SON:  Embarazo ectpico (embarazo fuera del tero).  Embarazos mltiples.  Dilatacin (apertura) precoz anormal del cuello del tero. Esto puede indicar un cuello incompetente y Biomedical scientist.  Aborto inminente.  Muerte fetal.  Los problemas con la placenta incluyen:  La placenta se ha desarrollado sobre la abertura del cuello del tero (placenta previa).  La placenta se ha separado anticipadamente en el tero (abrupcin placentaria).  La placenta se desarrolla en el msculo del tero (placenta acreta).  Tumores del Media planner, incluyendo la enfermedad trofoblstica gestacional. Se trata de un embarazo anormal en el que no hay feto. El tero se llena de quistes similares a uvas que en algunos casos son cancerosos.  Posicin incorrecta del feto (de nalgas, de vrtice).  Retraso del desarrollo fetal intrauterino (escaso desarrollo en el tero).  Anormalidades o infeccin fetal. RIESGOS Y COMPLICACIONES No hay riesgos conocidos para la ecografa. No se toman radiografas cuando se realiza una ecografa. Document Released: 01/28/2009 Document Revised: 01/04/2012 [facility] Patient Information 2015 [place]. This information is not intended to replace advice given to you by your health care provider. Make sure you discuss any questions you have with your health care provider.

## 2015-04-11 NOTE — Addendum Note (Signed)
Addended by: Terrance Mass on: 04/11/2015 12:33 PM   Modules accepted: Level of Service

## 2015-04-11 NOTE — Progress Notes (Signed)
   Patient's a 39 year old gravida 3 para 2 (1 normal spontaneous vaginal delivery and one cesarean section) with 1 spontaneous AB. Patient presented to the office complaining of dysmenorrhea and menorrhagia for several months now. Patient was informed the past that she may have fibroid uterus. She has not been using any form of contraception. She has stated that she has suffer from headaches quite frequently and had some nipple discharge but no uncertain about double vision. Patient denies any past history of any abnormal Pap smear. She has not had her thyroid tested and quite some time she does have hypothyroidism. Patient uncertain whether many years ago she has some form of STD when she was younger.  Exam: Blood pressure 118/76 weight 142 pounds height 5 feet 5 inches tall BMI 23.73 Gen. appearance well-developed nourished female in no acute distress Abdomen: Soft nontender no rebound or guarding Pelvic: Bartholin urethra Skene was within normal limits Vagina: No lesions or discharge Cervix no lesions or discharge Uterus: Anteverted normal size shape and consistency Adnexa: No palpable mass or tenderness Rectal exam: Not done  Assessment/plan: #1 patient with history of dysfunction uterine bleeding will return back to the office next week for sonohysterogram and endometrial biopsy. Patient currently not menstruating. We will also check her CBC. #2 because of galactorrhea and headaches were going to check her prolactin level #3 because of history of hypothyroidism and no recent blood work a TSH will be done todBecause  #4 Pap smear done today.

## 2015-04-15 LAB — CYTOLOGY - PAP

## 2015-05-06 ENCOUNTER — Other Ambulatory Visit: Payer: Self-pay | Admitting: Gynecology

## 2015-05-06 DIAGNOSIS — N939 Abnormal uterine and vaginal bleeding, unspecified: Secondary | ICD-10-CM

## 2015-05-10 ENCOUNTER — Telehealth: Payer: Self-pay | Admitting: Gynecology

## 2015-05-10 NOTE — Telephone Encounter (Signed)
05/10/15-I LM VM for patient that she has a $20 copay for the upcoming sonohysterogram with her [facility] insurance.wl

## 2015-05-13 ENCOUNTER — Ambulatory Visit (INDEPENDENT_AMBULATORY_CARE_PROVIDER_SITE_OTHER): Payer: 59

## 2015-05-13 ENCOUNTER — Encounter: Payer: Self-pay | Admitting: Gynecology

## 2015-05-13 ENCOUNTER — Ambulatory Visit (INDEPENDENT_AMBULATORY_CARE_PROVIDER_SITE_OTHER): Payer: 59 | Admitting: Gynecology

## 2015-05-13 ENCOUNTER — Other Ambulatory Visit: Payer: Self-pay | Admitting: Gynecology

## 2015-05-13 DIAGNOSIS — N939 Abnormal uterine and vaginal bleeding, unspecified: Secondary | ICD-10-CM | POA: Diagnosis not present

## 2015-05-13 DIAGNOSIS — D251 Intramural leiomyoma of uterus: Secondary | ICD-10-CM

## 2015-05-13 DIAGNOSIS — N8300 Follicular cyst of ovary, unspecified side: Secondary | ICD-10-CM

## 2015-05-13 DIAGNOSIS — N938 Other specified abnormal uterine and vaginal bleeding: Secondary | ICD-10-CM

## 2015-05-13 DIAGNOSIS — E221 Hyperprolactinemia: Secondary | ICD-10-CM

## 2015-05-13 DIAGNOSIS — E038 Other specified hypothyroidism: Secondary | ICD-10-CM

## 2015-05-13 NOTE — Progress Notes (Signed)
   Patient is a 39 year old was seen in the office on June 16 complaining of dysmenorrhea and menorrhagia for several months. Patient reported that no facility with a previous ultrasound she had a fibroid uterus. She is not using any form of contraception. She's been pregnant 3 times and has 2 children and 1 spontaneous miscarriage. Her deliveries were 1 vaginal and 1 C-section. She also had been followed by her primary because of her hypothyroidism and has not had her thyroid hormone levels checked and quite some time. She also had described last office visit that she was experiencing some galactorrhea but no unusual headaches. She is here for ultrasound and blood work. We did discuss also for cycle control as well as for contraception to consider the Mirena IUD for which literature information the previously been provided.  Ultrasound sonohysterogram today: Uterus measured 9.5 x 7.0 x 4.2 cm with endometrial stripe of 8.3 mm. A single intrauterine fibroid measuring 2.4 x 3.2 x 2.07 m was noted. Right ovary normal. Left ovary follicle measuring 2.4 x 1.9 x 2.1 mm thinwall echo-free. No fluid in the cul-de-sac.  The cervix was cleansed with Betadine solution and a sterile catheter was introduced into the uterine cavity and no intracavitary defects were noted. After worse a Pipelle was introduced into the uterine cavity and tissue was obtained and submitted for histological evaluation.  Assessment/plan: 39 year old with dysfunctional uterine bleeding history of hypothyroidism currently on Synthroid 200 g daily has not had her TSH checked and quite some time so we will check levels today along with prolactin because of her occasional galactorrhea. Normal sonohysterogram today. Patient will return back to the office next week to place a Mirena IUD for contraception and cycle control.

## 2015-05-15 ENCOUNTER — Telehealth: Payer: Self-pay | Admitting: Gynecology

## 2015-05-15 NOTE — Telephone Encounter (Signed)
05/15/15-I LM for pt that her [facility] insurance will cover the Mirena and insertion for contraception( per JF notes) at 100%, no copay. She will call if she wishes to proceed.wl

## 2015-07-25 ENCOUNTER — Ambulatory Visit (INDEPENDENT_AMBULATORY_CARE_PROVIDER_SITE_OTHER): Payer: 59 | Admitting: Family Medicine

## 2015-07-25 VITALS — BP 118/76 | HR 69 | Temp 98.7°F | Resp 16 | Ht 65.0 in | Wt 144.0 lb

## 2015-07-25 DIAGNOSIS — E034 Atrophy of thyroid (acquired): Secondary | ICD-10-CM | POA: Diagnosis not present

## 2015-07-25 DIAGNOSIS — M542 Cervicalgia: Secondary | ICD-10-CM | POA: Diagnosis not present

## 2015-07-25 DIAGNOSIS — E038 Other specified hypothyroidism: Secondary | ICD-10-CM | POA: Diagnosis not present

## 2015-07-25 MED ORDER — CYCLOBENZAPRINE HCL 5 MG PO TABS: 5.0000 mg | ORAL_TABLET | Freq: Every day | ORAL | Status: DC

## 2015-07-25 MED ORDER — NAPROXEN 500 MG PO TABS: 500.0000 mg | ORAL_TABLET | Freq: Two times a day (BID) | ORAL | Status: DC

## 2015-07-25 NOTE — Progress Notes (Signed)
Patient ID: Brittney Pena, female    DOB: 04/20/1976  Age: 39 y.o. MRN: [id number]  Chief Complaint  Patient presents with  . Medication Refill    Levothyroxine 200 mcg    Subjective:   39 year old lady who is here with history of having hypothyroidism. She's been on levothyroxine 200 g daily for a long time. She's been on the same dose for at least a year. She has been prescribed this elsewhere, at one point by Dr. Charisse Klinefelter next. He has been gone for several years. She is uncertain as to time table. She has had a lot of trouble with pain in the back of her neck and in the upper shoulders. She works doing housekeeping. Otherwise she does not have any major problems may be some mild shortness of breath at times. No chest pains or palpitations. No GI or GU complaints. No major musculoskeletal problems except for some aching in the legs.  She ran out of her medicine about 10 days ago so she has not had any recently.  Current allergies, medications, problem list, past/family and social histories reviewed.  Objective:  BP 118/76 mmHg  Pulse 69  Temp(Src) 98.7 F (37.1 C) (Oral)  Resp 16  Ht 5\' 5"  (1.651 m)  Wt 144 lb (65.318 kg)  BMI 23.96 kg/m2  SpO2 98%  LMP 07/01/2015  No acute distress. Alert and oriented. Eyes PERRLA. Throat clear. Neck supple without nodes. She is tender from the occipital region down into the upper trapezius, more right than the left. Her chest is clear to auscultation. Heart regular, no arrhythmias, that hernia. Abdomen was soft without masses or tenderness. Extremities are without edema. Her last menstrual cycle was September 5 deep tendon reflexes are 1+ bilaterally. No slowed ankle jerks were noted.  Assessment & Plan:   Assessment: 1. Hypothyroidism due to acquired atrophy of thyroid   2. Cervical pain       Plan: A little difficult to straighten this out because she has a history of hypothyroidism but has been off her medications and did not have  any labs to use as baseline.  Orders Placed This Encounter  Procedures  . TSH  . T3, free    Meds ordered this encounter  Medications  . naproxen (NAPROSYN) 500 MG tablet    Sig: Take 1 tablet (500 mg total) by mouth 2 (two) times daily with a meal.    Dispense:  30 tablet    Refill:  1  . cyclobenzaprine (FLEXERIL) 5 MG tablet    Sig: Take 1 tablet (5 mg total) by mouth at bedtime.    Dispense:  30 tablet    Refill:  1         Patient Instructions  We will let you know the results of your thyroid test in a couple of days. You for any been off the medicine for 10 days, and I'm not going to restart it until I see what your blood level is in the next couple of days.  Take the muscle relaxant cyclobenzaprine on it at bedtime for the neck  Take naproxen one twice daily as needed for pain in the neck  Depending on the results of your thyroid tests I will have to decide when we will recheck it, but usually after we adjust the dose it should be rechecked in about 3 months. If for any reason I failed to contact you by Monday please call back again regarding the results. At that time we will  tell you when to come back and get a repeat level done.  If feeling worse return at any time.     Return if symptoms worsen or fail to improve.   HOPPER,DAVID, MD 07/25/2015

## 2015-07-25 NOTE — Patient Instructions (Addendum)
We will let you know the results of your thyroid test in a couple of days. You for any been off the medicine for 10 days, and I'm not going to restart it until I see what your blood level is in the next couple of days.  Take the muscle relaxant cyclobenzaprine on it at bedtime for the neck  Take naproxen one twice daily as needed for pain in the neck  Depending on the results of your thyroid tests I will have to decide when we will recheck it, but usually after we adjust the dose it should be rechecked in about 3 months. If for any reason I failed to contact you by Monday please call back again regarding the results. At that time we will tell you when to come back and get a repeat level done.  If feeling worse return at any time.

## 2015-07-26 ENCOUNTER — Other Ambulatory Visit: Payer: Self-pay | Admitting: Family Medicine

## 2015-07-26 DIAGNOSIS — E039 Hypothyroidism, unspecified: Secondary | ICD-10-CM

## 2015-07-26 LAB — T3, FREE: T3 FREE: 1 pg/mL — AB (ref 2.3–4.2)

## 2015-07-26 LAB — TSH: TSH: 188.188 u[IU]/mL — ABNORMAL HIGH (ref 0.350–4.500)

## 2015-07-26 MED ORDER — LEVOTHYROXINE SODIUM 200 MCG PO TABS: 200.0000 ug | ORAL_TABLET | Freq: Every day | ORAL | Status: DC

## 2015-07-26 MED ORDER — LEVOTHYROXINE SODIUM 50 MCG PO TABS: ORAL_TABLET | ORAL | Status: DC

## 2015-11-11 ENCOUNTER — Other Ambulatory Visit: Payer: Self-pay | Admitting: Family Medicine

## 2016-12-14 ENCOUNTER — Ambulatory Visit: Payer: Self-pay

## 2017-03-10 ENCOUNTER — Encounter: Payer: Self-pay | Admitting: Gynecology

## 2018-03-03 ENCOUNTER — Encounter: Payer: Self-pay | Admitting: Physician Assistant

## 2018-03-03 ENCOUNTER — Other Ambulatory Visit: Payer: Self-pay

## 2018-03-03 ENCOUNTER — Ambulatory Visit (INDEPENDENT_AMBULATORY_CARE_PROVIDER_SITE_OTHER): Payer: BLUE CROSS/BLUE SHIELD | Admitting: Physician Assistant

## 2018-03-03 VITALS — BP 101/67 | HR 80 | Temp 98.0°F | Resp 18 | Ht 64.76 in | Wt 138.4 lb

## 2018-03-03 DIAGNOSIS — Z113 Encounter for screening for infections with a predominantly sexual mode of transmission: Secondary | ICD-10-CM | POA: Diagnosis not present

## 2018-03-03 DIAGNOSIS — E039 Hypothyroidism, unspecified: Secondary | ICD-10-CM

## 2018-03-03 DIAGNOSIS — Z1389 Encounter for screening for other disorder: Secondary | ICD-10-CM

## 2018-03-03 DIAGNOSIS — Z23 Encounter for immunization: Secondary | ICD-10-CM

## 2018-03-03 DIAGNOSIS — Z13220 Encounter for screening for lipoid disorders: Secondary | ICD-10-CM

## 2018-03-03 DIAGNOSIS — R102 Pelvic and perineal pain unspecified side: Secondary | ICD-10-CM

## 2018-03-03 DIAGNOSIS — Z0000 Encounter for general adult medical examination without abnormal findings: Secondary | ICD-10-CM

## 2018-03-03 DIAGNOSIS — Z130 Encounter for screening for diseases of the blood and blood-forming organs and certain disorders involving the immune mechanism: Secondary | ICD-10-CM

## 2018-03-03 DIAGNOSIS — N926 Irregular menstruation, unspecified: Secondary | ICD-10-CM

## 2018-03-03 DIAGNOSIS — Z13228 Encounter for screening for other metabolic disorders: Secondary | ICD-10-CM | POA: Diagnosis not present

## 2018-03-03 LAB — POCT WET + KOH PREP
Trich by wet prep: ABSENT
YEAST BY WET PREP: ABSENT
Yeast by KOH: ABSENT

## 2018-03-03 LAB — POCT URINALYSIS DIP (MANUAL ENTRY)
BILIRUBIN UA: NEGATIVE mg/dL
Bilirubin, UA: NEGATIVE
Glucose, UA: NEGATIVE mg/dL
LEUKOCYTES UA: NEGATIVE
Nitrite, UA: NEGATIVE
Protein Ur, POC: NEGATIVE mg/dL
SPEC GRAV UA: 1.02 (ref 1.010–1.025)
Urobilinogen, UA: 0.2 E.U./dL
pH, UA: 8 (ref 5.0–8.0)

## 2018-03-03 LAB — POCT URINE PREGNANCY: Preg Test, Ur: NEGATIVE

## 2018-03-03 MED ORDER — LEVOTHYROXINE SODIUM 175 MCG PO TABS: 175.0000 ug | ORAL_TABLET | Freq: Every day | ORAL | 1 refills | Status: DC

## 2018-03-03 NOTE — Progress Notes (Signed)
Brittney Pena  MRN: [id number] DOB: 08/15/1976  Subjective:  Pt is a 42 y.o. G3P2 female who presents for annual physical exam. She is not fasting today.   Diet: chicken, fish, vegetables, fruits. Does not eat much dairy. Drinks water. Takes some vitamins.  Exercise: Walks a little every day.  Menstrual cycles: Regular, occur monthly. Typically last 5 days. Not on birth control. She is sexually active with monogamous partner.  Sleep: 7-8 hours. BM: Daily  Last annual exam: 2016 Last dental exam: "long time ago" Last vision exam: Years ago Last pap smear: 03/2015 Last mammogram: 5 years ago  Vaccinations      Tetanus: >10 years ago  Chronic medical conditions: 1) Hypothyroidism: On synthroid 175mg. Gets TSH checked at a clinic nearby. Last TSH check was about 5 months ago and it was normal.  2) Left lower pelvic pain x 2.5 years: Intermittent pain. Cannot identify any aggravating or relieving factors. Does endorse dysmenorrhea and menorrhagia.  Denies any associated symptoms.  Has PMH of uterine fibroids and left ovarian cysts. Has had UKoreain the past and would like another to see if anything has worsened. Used to follow gynecology but has not seen them in a while. Was planning on having them place IUD but never followed up. She is still interested in IUD.  Patient Active Problem List   Diagnosis Date Noted  . Cluster headache 04/11/2015  . Galactorrhea 04/11/2015  . DUB (dysfunctional uterine bleeding) 04/11/2015  . Other specified hypothyroidism 04/11/2015    Current Outpatient Medications on File Prior to Visit  Medication Sig Dispense Refill  . cyclobenzaprine (FLEXERIL) 5 MG tablet Take 1 tablet (5 mg total) by mouth at bedtime. 30 tablet 1  . levothyroxine (SYNTHROID, LEVOTHROID) 200 MCG tablet TAKE ONE TABLET BY MOUTH ONCE DAILY BEFORE  BREAKFAST 30 tablet 0  . levothyroxine (SYNTHROID, LEVOTHROID) 50 MCG tablet Take one daily for 3 days, then 2 daily for 3 days then 3  daily for 3 days 30 tablet 0  . naproxen (NAPROSYN) 500 MG tablet Take 1 tablet (500 mg total) by mouth 2 (two) times daily with a meal. 30 tablet 1   No current facility-administered medications on file prior to visit.     No Known Allergies  Social History   Socioeconomic History  . Marital status: Single    Spouse name: Not on file  . Number of children: 2  . Years of education: Not on file  . Highest education level: Not on file  Occupational History  . Not on file  Social Needs  . Financial resource strain: Not on file  . Food insecurity:    Worry: Not on file    Inability: Not on file  . Transportation needs:    Medical: Not on file    Non-medical: Not on file  Tobacco Use  . Smoking status: Current Every Day Smoker    Packs/day: 0.25    Types: Cigarettes  . Smokeless tobacco: Never Used  Substance and Sexual Activity  . Alcohol use: No    Alcohol/week: 0.0 oz  . Drug use: No  . Sexual activity: Yes    Birth control/protection: Condom, None  Lifestyle  . Physical activity:    Days per week: Not on file    Minutes per session: Not on file  . Stress: Not on file  Relationships  . Social connections:    Talks on phone: Not on file    Gets together: Not on file  Attends religious service: Not on file    Active member of club or organization: Not on file    Attends meetings of clubs or organizations: Not on file    Relationship status: Not on file  Other Topics Concern  . Not on file  Social History Narrative  . Not on file    Past Surgical History:  Procedure Laterality Date  . CESAREAN SECTION     x1    Family History  Problem Relation Age of Onset  . Prostate cancer Father   . Breast cancer Maternal Aunt 45    Review of Systems  Constitutional: Positive for fatigue. Negative for activity change, appetite change, chills, diaphoresis, fever and unexpected weight change.  HENT: Negative for congestion, dental problem, drooling, ear discharge,  ear pain, facial swelling, hearing loss, mouth sores, nosebleeds, postnasal drip, rhinorrhea, sinus pressure, sinus pain, sneezing, sore throat, tinnitus, trouble swallowing and voice change.   Eyes: Positive for redness. Negative for photophobia, pain, discharge, itching and visual disturbance.  Respiratory: Negative for apnea, cough, choking, chest tightness, shortness of breath, wheezing and stridor.   Cardiovascular: Negative for chest pain, palpitations and leg swelling.  Gastrointestinal: Negative for abdominal distention, abdominal pain, anal bleeding, blood in stool, constipation, diarrhea, nausea, rectal pain and vomiting.  Endocrine: Positive for polyuria. Negative for cold intolerance, heat intolerance, polydipsia and polyphagia.  Genitourinary: Negative for decreased urine volume, difficulty urinating, dyspareunia, dysuria, enuresis, flank pain, frequency, genital sores, hematuria, menstrual problem, pelvic pain, urgency, vaginal bleeding, vaginal discharge and vaginal pain.  Musculoskeletal: Positive for neck pain. Negative for arthralgias, back pain, gait problem, joint swelling, myalgias and neck stiffness.  Skin: Negative for color change, pallor, rash and wound.  Allergic/Immunologic: Negative for environmental allergies, food allergies and immunocompromised state.  Neurological: Negative for dizziness, tremors, seizures, syncope, facial asymmetry, speech difficulty, weakness, light-headedness, numbness and headaches.  Hematological: Negative for adenopathy. Does not bruise/bleed easily.  Psychiatric/Behavioral: Negative for agitation, behavioral problems, confusion, decreased concentration, dysphoric mood, hallucinations, self-injury, sleep disturbance and suicidal ideas. The patient is not nervous/anxious and is not hyperactive.     Objective:  BP 101/67 (BP Location: Right Arm, Patient Position: Sitting, Cuff Size: Normal)   Pulse 80   Temp 98 F (36.7 C) (Oral)   Resp 18   Ht  5' 4.76" (1.645 m)   Wt 138 lb 6.4 oz (62.8 kg)   LMP 01/31/2018 (Approximate)   SpO2 97%   BMI 23.20 kg/m   Physical Exam  Constitutional: She is oriented to person, place, and time. She appears well-developed and well-nourished. No distress.  HENT:  Head: Normocephalic and atraumatic.  Right Ear: Hearing, tympanic membrane, external ear and ear canal normal.  Left Ear: Hearing, tympanic membrane, external ear and ear canal normal.  Nose: Nose normal.  Mouth/Throat: Uvula is midline, oropharynx is clear and moist and mucous membranes are normal. No oropharyngeal exudate.  Eyes: Pupils are equal, round, and reactive to light. Conjunctivae, EOM and lids are normal. No scleral icterus.  Neck: Trachea normal and normal range of motion. No thyroid mass and no thyromegaly present.  Cardiovascular: Normal rate, regular rhythm, normal heart sounds and intact distal pulses.  Pulmonary/Chest: Effort normal and breath sounds normal. Right breast exhibits no inverted nipple, no mass, no nipple discharge, no skin change and no tenderness. Left breast exhibits no inverted nipple, no mass, no nipple discharge, no skin change and no tenderness. Breasts are symmetrical.  Breasts are dense bilaterally.   Abdominal: Soft. Normal  appearance and bowel sounds are normal. There is no tenderness. There is no rigidity and no guarding.  Genitourinary: Vagina normal and uterus normal. Uterus is not deviated, not enlarged, not fixed and not tender. Cervix exhibits no motion tenderness. Right adnexum displays no mass, no tenderness and no fullness. Left adnexum displays tenderness (with deep palpation only). Left adnexum displays no mass and no fullness.  Lymphadenopathy:       Head (right side): No tonsillar, no preauricular, no posterior auricular and no occipital adenopathy present.       Head (left side): No tonsillar, no preauricular, no posterior auricular and no occipital adenopathy present.    She has no  cervical adenopathy.       Right: No supraclavicular adenopathy present.       Left: No supraclavicular adenopathy present.  Neurological: She is alert and oriented to person, place, and time. She has normal strength and normal reflexes.  Skin: Skin is warm and dry.    Visual Acuity Screening   Right eye Left eye Both eyes  Without correction:     With correction: '20/30 20/25 20/25 '   Results for orders placed or performed in visit on 03/03/18 (from the past 24 hour(s))  POCT urine pregnancy     Status: None   Collection Time: 03/03/18  2:49 PM  Result Value Ref Range   Preg Test, Ur Negative Negative  POCT urinalysis dipstick     Status: Abnormal   Collection Time: 03/03/18  2:50 PM  Result Value Ref Range   Color, UA yellow yellow   Clarity, UA clear clear   Glucose, UA negative negative mg/dL   Bilirubin, UA negative negative   Ketones, POC UA negative negative mg/dL   Spec Grav, UA 1.020 1.010 - 1.025   Blood, UA trace-intact (A) negative   pH, UA 8.0 5.0 - 8.0   Protein Ur, POC negative negative mg/dL   Urobilinogen, UA 0.2 0.2 or 1.0 E.U./dL   Nitrite, UA Negative Negative   Leukocytes, UA Negative Negative  POCT Wet + KOH Prep     Status: Abnormal   Collection Time: 03/03/18  2:57 PM  Result Value Ref Range   Yeast by KOH Absent Absent   Yeast by wet prep Absent Absent   WBC by wet prep Few Few   Clue Cells Wet Prep HPF POC None None   Trich by wet prep Absent Absent   Bacteria Wet Prep HPF POC Few Few   Epithelial Cells By [employer] ([facility]) Many (A) None, Few, Too numerous to count   RBC,UR,HPF,POC None None RBC/hpf    Assessment and Plan :  Discussed healthy lifestyle, diet, exercise, preventative care, vaccinations, and addressed patient's concerns. Plan for follow up in one year. Otherwise, plan for specific conditions below. 1. Annual physical exam Await lab results.   2. Hypothyroidism, unspecified type - TSH - levothyroxine (SYNTHROID, LEVOTHROID) 175 MCG  tablet; Take 1 tablet (175 mcg total) by mouth daily before breakfast.  Dispense: 90 tablet; Refill: 1  3. Screening for hematuria or proteinuria - POCT urinalysis dipstick  4. Screening cholesterol level - Lipid panel  5. Screening for metabolic disorder - AUQ33+HLKT  6. Screening, anemia, deficiency, iron - CBC with Differential/Platelet  7. Screen for STD (sexually transmitted disease) - GC/Chlamydia Probe Amp - Hepatitis panel, acute - HIV antibody - RPR - POCT Wet + KOH Prep  8. Menstrual period late POC preg test negative - POCT urine pregnancy  9. Pelvic pain  in female Chronic in nature.  Has mild tenderness to palpation of the left adnexa, otherwise normal exam.  STD panel pending.  Due to PMH of ovarian cysts and uterine fibroids, will repeat transvaginal pelvic ultrasound this time.  Depending on results, will likely refer to gynecology for further evaluation. - US Transvaginal Non-OB; Future - US Pelvis Complete; Future  10. Need for Tdap vaccination - Tdap vaccine greater than or equal to 7yo IM   Tenna Delaine PA-C  [facility] 03/03/2018 2:08 PM

## 2018-03-03 NOTE — Patient Instructions (Addendum)
We should have your lab work back within one week. We have ordered an ultrasound.  You should hear from them within 1 to 2 weeks.  In the meantime, I recommend scheduling a mammogram.   Health Maintenance, Female Adopting a healthy lifestyle and getting preventive care can go a long way to promote health and wellness. Talk with your health care provider about what schedule of regular examinations is right for you. This is a good chance for you to check in with your provider about disease prevention and staying healthy. In between checkups, there are plenty of things you can do on your own. Experts have done a lot of research about which lifestyle changes and preventive measures are most likely to keep you healthy. Ask your health care provider for more information. Weight and diet Eat a healthy diet  Be sure to include plenty of vegetables, fruits, low-fat dairy products, and lean protein.  Do not eat a lot of foods high in solid fats, added sugars, or salt.  Get regular exercise. This is one of the most important things you can do for your health. ? Most adults should exercise for at least 150 minutes each week. The exercise should increase your heart rate and make you sweat (moderate-intensity exercise). ? Most adults should also do strengthening exercises at least twice a week. This is in addition to the moderate-intensity exercise.  Maintain a healthy weight  Body mass index (BMI) is a measurement that can be used to identify possible weight problems. It estimates body fat based on height and weight. Your health care provider can help determine your BMI and help you achieve or maintain a healthy weight.  For females 71 years of age and older: ? A BMI below 18.5 is considered underweight. ? A BMI of 18.5 to 24.9 is normal. ? A BMI of 25 to 29.9 is considered overweight. ? A BMI of 30 and above is considered obese.  Watch levels of cholesterol and blood lipids  You should start having  your blood tested for lipids and cholesterol at 42 years of age, then have this test every 5 years.  You may need to have your cholesterol levels checked more often if: ? Your lipid or cholesterol levels are high. ? You are older than 42 years of age. ? You are at high risk for heart disease.  Cancer screening Lung Cancer  Lung cancer screening is recommended for adults 23-4 years old who are at high risk for lung cancer because of a history of smoking.  A yearly low-dose CT scan of the lungs is recommended for people who: ? Currently smoke. ? Have quit within the past 15 years. ? Have at least a 30-pack-year history of smoking. A pack year is smoking an average of one pack of cigarettes a day for 1 year.  Yearly screening should continue until it has been 15 years since you quit.  Yearly screening should stop if you develop a health problem that would prevent you from having lung cancer treatment.  Breast Cancer  Practice breast self-awareness. This means understanding how your breasts normally appear and feel.  It also means doing regular breast self-exams. Let your health care provider know about any changes, no matter how small.  If you are in your 20s or 30s, you should have a clinical breast exam (CBE) by a health care provider every 1-3 years as part of a regular health exam.  If you are 79 or older, have a CBE  every year. Also consider having a breast X-ray (mammogram) every year.  If you have a family history of breast cancer, talk to your health care provider about genetic screening.  If you are at high risk for breast cancer, talk to your health care provider about having an MRI and a mammogram every year.  Breast cancer gene (BRCA) assessment is recommended for women who have family members with BRCA-related cancers. BRCA-related cancers include: ? Breast. ? Ovarian. ? Tubal. ? Peritoneal cancers.  Results of the assessment will determine the need for genetic  counseling and BRCA1 and BRCA2 testing.  Cervical Cancer Your health care provider may recommend that you be screened regularly for cancer of the pelvic organs (ovaries, uterus, and vagina). This screening involves a pelvic examination, including checking for microscopic changes to the surface of your cervix (Pap test). You may be encouraged to have this screening done every 3 years, beginning at age 21.  For women ages 30-65, health care providers may recommend pelvic exams and Pap testing every 3 years, or they may recommend the Pap and pelvic exam, combined with testing for human papilloma virus (HPV), every 5 years. Some types of HPV increase your risk of cervical cancer. Testing for HPV may also be done on women of any age with unclear Pap test results.  Other health care providers may not recommend any screening for nonpregnant women who are considered low risk for pelvic cancer and who do not have symptoms. Ask your health care provider if a screening pelvic exam is right for you.  If you have had past treatment for cervical cancer or a condition that could lead to cancer, you need Pap tests and screening for cancer for at least 20 years after your treatment. If Pap tests have been discontinued, your risk factors (such as having a new sexual partner) need to be reassessed to determine if screening should resume. Some women have medical problems that increase the chance of getting cervical cancer. In these cases, your health care provider may recommend more frequent screening and Pap tests.  Colorectal Cancer  This type of cancer can be detected and often prevented.  Routine colorectal cancer screening usually begins at 42 years of age and continues through 42 years of age.  Your health care provider may recommend screening at an earlier age if you have risk factors for colon cancer.  Your health care provider may also recommend using home test kits to check for hidden blood in the  stool.  A small camera at the end of a tube can be used to examine your colon directly (sigmoidoscopy or colonoscopy). This is done to check for the earliest forms of colorectal cancer.  Routine screening usually begins at age 50.  Direct examination of the colon should be repeated every 5-10 years through 42 years of age. However, you may need to be screened more often if early forms of precancerous polyps or small growths are found.  Skin Cancer  Check your skin from head to toe regularly.  Tell your health care provider about any new moles or changes in moles, especially if there is a change in a mole's shape or color.  Also tell your health care provider if you have a mole that is larger than the size of a pencil eraser.  Always use sunscreen. Apply sunscreen liberally and repeatedly throughout the day.  Protect yourself by wearing long sleeves, pants, a wide-brimmed hat, and sunglasses whenever you are outside.  Heart disease, diabetes,   and high blood pressure  High blood pressure causes heart disease and increases the risk of stroke. High blood pressure is more likely to develop in: ? People who have blood pressure in the high end of the normal range (130-139/85-89 mm Hg). ? People who are overweight or obese. ? People who are African American.  If you are 18-39 years of age, have your blood pressure checked every 3-5 years. If you are 40 years of age or older, have your blood pressure checked every year. You should have your blood pressure measured twice-once when you are at a hospital or clinic, and once when you are not at a hospital or clinic. Record the average of the two measurements. To check your blood pressure when you are not at a hospital or clinic, you can use: ? An automated blood pressure machine at a pharmacy. ? A home blood pressure monitor.  If you are between 55 years and 79 years old, ask your health care provider if you should take aspirin to prevent  strokes.  Have regular diabetes screenings. This involves taking a blood sample to check your fasting blood sugar level. ? If you are at a normal weight and have a low risk for diabetes, have this test once every three years after 42 years of age. ? If you are overweight and have a high risk for diabetes, consider being tested at a younger age or more often. Preventing infection Hepatitis B  If you have a higher risk for hepatitis B, you should be screened for this virus. You are considered at high risk for hepatitis B if: ? You were born in a country where hepatitis B is common. Ask your health care provider which countries are considered high risk. ? Your parents were born in a high-risk country, and you have not been immunized against hepatitis B (hepatitis B vaccine). ? You have HIV or AIDS. ? You use needles to inject street drugs. ? You live with someone who has hepatitis B. ? You have had sex with someone who has hepatitis B. ? You get hemodialysis treatment. ? You take certain medicines for conditions, including cancer, organ transplantation, and autoimmune conditions.  Hepatitis C  Blood testing is recommended for: ? Everyone born from 1945 through 1965. ? Anyone with known risk factors for hepatitis C.  Sexually transmitted infections (STIs)  You should be screened for sexually transmitted infections (STIs) including gonorrhea and chlamydia if: ? You are sexually active and are younger than 42 years of age. ? You are older than 42 years of age and your health care provider tells you that you are at risk for this type of infection. ? Your sexual activity has changed since you were last screened and you are at an increased risk for chlamydia or gonorrhea. Ask your health care provider if you are at risk.  If you do not have HIV, but are at risk, it may be recommended that you take a prescription medicine daily to prevent HIV infection. This is called pre-exposure prophylaxis  (PrEP). You are considered at risk if: ? You are sexually active and do not regularly use condoms or know the HIV status of your partner(s). ? You take drugs by injection. ? You are sexually active with a partner who has HIV.  Talk with your health care provider about whether you are at high risk of being infected with HIV. If you choose to begin PrEP, you should first be tested for HIV. You should then   be tested every 3 months for as long as you are taking PrEP. Pregnancy  If you are premenopausal and you may become pregnant, ask your health care provider about preconception counseling.  If you may become pregnant, take 400 to 800 micrograms (mcg) of folic acid every day.  If you want to prevent pregnancy, talk to your health care provider about birth control (contraception). Osteoporosis and menopause  Osteoporosis is a disease in which the bones lose minerals and strength with aging. This can result in serious bone fractures. Your risk for osteoporosis can be identified using a bone density scan.  If you are 55 years of age or older, or if you are at risk for osteoporosis and fractures, ask your health care provider if you should be screened.  Ask your health care provider whether you should take a calcium or vitamin D supplement to lower your risk for osteoporosis.  Menopause may have certain physical symptoms and risks.  Hormone replacement therapy may reduce some of these symptoms and risks. Talk to your health care provider about whether hormone replacement therapy is right for you. Follow these instructions at home:  Schedule regular health, dental, and eye exams.  Stay current with your immunizations.  Do not use any tobacco products including cigarettes, chewing tobacco, or electronic cigarettes.  If you are pregnant, do not drink alcohol.  If you are breastfeeding, limit how much and how often you drink alcohol.  Limit alcohol intake to no more than 1 drink per day for  nonpregnant women. One drink equals 12 ounces of beer, 5 ounces of wine, or 1 ounces of hard liquor.  Do not use street drugs.  Do not share needles.  Ask your health care provider for help if you need support or information about quitting drugs.  Tell your health care provider if you often feel depressed.  Tell your health care provider if you have ever been abused or do not feel safe at home. This information is not intended to replace advice given to you by your health care provider. Make sure you discuss any questions you have with your health care provider. Document Released: 04/27/2011 Document Revised: 03/19/2016 Document Reviewed: 07/16/2015 Elsevier Interactive Patient Education  2018 [employer].    IF you received an x-ray today, you will receive an invoice from [facility]. Please contact [facility] at [phone] with questions or concerns regarding your invoice.   IF you received labwork today, you will receive an invoice from [facility]. Please contact [facility] at [phone] with questions or concerns regarding your invoice.   Our billing staff will not be able to assist you with questions regarding bills from these companies.  You will be contacted with the lab results as soon as they are available. The fastest way to get your results is to activate your My Chart account. Instructions are located on the last page of this paperwork. If you have not heard from [place] regarding the results in 2 weeks, please contact this office.

## 2018-03-04 ENCOUNTER — Encounter: Payer: Self-pay | Admitting: Physician Assistant

## 2018-03-04 LAB — LIPID PANEL
CHOLESTEROL TOTAL: 140 mg/dL (ref 100–199)
Chol/HDL Ratio: 1.9 ratio (ref 0.0–4.4)
HDL: 72 mg/dL (ref >39)
LDL Calculated: 59 mg/dL (ref 0–99)
Triglycerides: 45 mg/dL (ref 0–149)
VLDL Cholesterol Cal: 9 mg/dL (ref 5–40)

## 2018-03-04 LAB — CBC WITH DIFFERENTIAL/PLATELET
BASOS ABS: 0.1 10*3/uL (ref 0.0–0.2)
BASOS: 1 %
EOS (ABSOLUTE): 0.2 10*3/uL (ref 0.0–0.4)
Eos: 2 %
HEMOGLOBIN: 14.1 g/dL (ref 11.1–15.9)
Hematocrit: 43.1 % (ref 34.0–46.6)
Immature Grans (Abs): 0 10*3/uL (ref 0.0–0.1)
Immature Granulocytes: 0 %
LYMPHS ABS: 2.4 10*3/uL (ref 0.7–3.1)
Lymphs: 22 %
MCH: 30.8 pg (ref 26.6–33.0)
MCHC: 32.7 g/dL (ref 31.5–35.7)
MCV: 94 fL (ref 79–97)
Monocytes Absolute: 0.6 10*3/uL (ref 0.1–0.9)
Monocytes: 5 %
NEUTROS ABS: 7.7 10*3/uL — AB (ref 1.4–7.0)
Neutrophils: 70 %
Platelets: 317 10*3/uL (ref 150–379)
RBC: 4.58 x10E6/uL (ref 3.77–5.28)
RDW: 13.4 % (ref 12.3–15.4)
WBC: 11 10*3/uL — ABNORMAL HIGH (ref 3.4–10.8)

## 2018-03-04 LAB — HEPATITIS PANEL, ACUTE
HEP B C IGM: NEGATIVE
HEP B S AG: NEGATIVE
Hep A IgM: NEGATIVE

## 2018-03-04 LAB — GC/CHLAMYDIA PROBE AMP
Chlamydia trachomatis, NAA: NEGATIVE
Neisseria gonorrhoeae by PCR: NEGATIVE

## 2018-03-04 LAB — CMP14+EGFR
ALBUMIN: 4.9 g/dL (ref 3.5–5.5)
ALT: 12 IU/L (ref 0–32)
AST: 16 IU/L (ref 0–40)
Albumin/Globulin Ratio: 1.8 (ref 1.2–2.2)
Alkaline Phosphatase: 49 IU/L (ref 39–117)
BUN / CREAT RATIO: 21 (ref 9–23)
BUN: 15 mg/dL (ref 6–24)
Bilirubin Total: 0.5 mg/dL (ref 0.0–1.2)
CALCIUM: 9.5 mg/dL (ref 8.7–10.2)
CHLORIDE: 104 mmol/L (ref 96–106)
CO2: 19 mmol/L — AB (ref 20–29)
CREATININE: 0.73 mg/dL (ref 0.57–1.00)
GFR calc Af Amer: 118 mL/min/{1.73_m2} (ref >59)
GFR, EST NON AFRICAN AMERICAN: 103 mL/min/{1.73_m2} (ref >59)
GLOBULIN, TOTAL: 2.8 g/dL (ref 1.5–4.5)
GLUCOSE: 87 mg/dL (ref 65–99)
POTASSIUM: 4.5 mmol/L (ref 3.5–5.2)
Sodium: 141 mmol/L (ref 134–144)
Total Protein: 7.7 g/dL (ref 6.0–8.5)

## 2018-03-04 LAB — HIV ANTIBODY (ROUTINE TESTING W REFLEX): HIV Screen 4th Generation wRfx: NONREACTIVE

## 2018-03-04 LAB — RPR: RPR: NONREACTIVE

## 2018-03-04 LAB — TSH: TSH: 1.84 u[IU]/mL (ref 0.450–4.500)

## 2018-03-15 ENCOUNTER — Telehealth: Payer: Self-pay | Admitting: Physician Assistant

## 2018-03-15 NOTE — Telephone Encounter (Signed)
Copied from [place] [id number]. Topic: Quick Communication - See Telephone Encounter >> Mar 15, 2018 11:21 AM Boyd Kerbs wrote: CRM for notification. See Telephone encounter for: 03/15/18.  Pt. Is calling about referrals, have not heard anything from them for appt.   She is stating her blood count was elevated and she is not sure what she needs to do from here.  Prescription or test?  Please call pt back.. She does need spanish interpreter

## 2018-04-20 ENCOUNTER — Ambulatory Visit
Admission: RE | Admit: 2018-04-20 | Discharge: 2018-04-20 | Disposition: A | Discharge status: Home / Self Care | Payer: BLUE CROSS/BLUE SHIELD | Source: Ambulatory Visit | Attending: Physician Assistant | Admitting: Physician Assistant

## 2018-04-20 DIAGNOSIS — R102 Pelvic and perineal pain: Secondary | ICD-10-CM

## 2018-04-21 ENCOUNTER — Other Ambulatory Visit: Payer: Self-pay | Admitting: Physician Assistant

## 2018-04-21 DIAGNOSIS — R102 Pelvic and perineal pain: Secondary | ICD-10-CM

## 2018-04-21 DIAGNOSIS — Z3009 Encounter for other general counseling and advice on contraception: Secondary | ICD-10-CM

## 2018-04-21 NOTE — Progress Notes (Signed)
Orders Placed This Encounter  Procedures  . Ambulatory referral to Gynecology    Referral Priority:   Routine    Referral Type:   Consultation    Referral Reason:   Specialty Services Required    Requested Specialty:   Gynecology    Number of Visits Requested:   1

## 2018-04-25 ENCOUNTER — Telehealth: Payer: Self-pay | Admitting: Physician Assistant

## 2018-04-25 DIAGNOSIS — Z1239 Encounter for other screening for malignant neoplasm of breast: Secondary | ICD-10-CM

## 2018-04-25 NOTE — Telephone Encounter (Signed)
Pt requesting mammogram order. She would like this to go to [facility] Please advise. Thanks!

## 2018-05-05 ENCOUNTER — Ambulatory Visit (HOSPITAL_BASED_OUTPATIENT_CLINIC_OR_DEPARTMENT_OTHER)
Admission: RE | Admit: 2018-05-05 | Discharge: 2018-05-05 | Disposition: A | Discharge status: Home / Self Care | Payer: BLUE CROSS/BLUE SHIELD | Source: Ambulatory Visit | Attending: Physician Assistant | Admitting: Physician Assistant

## 2018-05-05 ENCOUNTER — Encounter (HOSPITAL_BASED_OUTPATIENT_CLINIC_OR_DEPARTMENT_OTHER): Payer: Self-pay

## 2018-05-05 DIAGNOSIS — Z1231 Encounter for screening mammogram for malignant neoplasm of breast: Secondary | ICD-10-CM | POA: Diagnosis present

## 2018-05-05 DIAGNOSIS — Z1239 Encounter for other screening for malignant neoplasm of breast: Secondary | ICD-10-CM

## 2018-05-19 ENCOUNTER — Encounter: Payer: Self-pay | Admitting: Gynecology

## 2018-05-19 ENCOUNTER — Ambulatory Visit: Payer: BLUE CROSS/BLUE SHIELD | Admitting: Gynecology

## 2018-05-19 VITALS — BP 116/72 | Ht 65.0 in | Wt 140.0 lb

## 2018-05-19 DIAGNOSIS — R1032 Left lower quadrant pain: Secondary | ICD-10-CM | POA: Diagnosis not present

## 2018-05-19 DIAGNOSIS — Z01419 Encounter for gynecological examination (general) (routine) without abnormal findings: Secondary | ICD-10-CM | POA: Diagnosis not present

## 2018-05-19 NOTE — Progress Notes (Signed)
    Brittney Pena Jul 28, 1976 [id number]        41 y.o.  G3P2 for annual gynecologic exam.  Complaining of sharp stabbing lower pelvic pain occurs sporadically several times weekly lasting minutes.  Usually in the left lower quadrant.  Also having issues with constipation going days without bowel movements.  No diarrhea.  No nausea vomiting.  No urinary symptoms such as frequency dysuria urgency low back pain fever or chills.  Regular monthly menses.  Pain does not seem linked to her cycle.  Using condom birth control which is acceptable to her.  Recent STD screening elsewhere negative.  Past medical history,surgical history, problem list, medications, allergies, family history and social history were all reviewed and documented as reviewed in the EPIC chart.  ROS:  Performed with pertinent positives and negatives included in the history, assessment and plan.   Additional significant findings : None   Exam: Caryn Bee assistant Vitals:   05/19/18 1543  BP: 116/72  Weight: 140 lb (63.5 kg)  Height: 5\' 5"  (1.651 m)   Body mass index is 23.3 kg/m.  General appearance:  Normal affect, orientation and appearance. Skin: Grossly normal HEENT: Without gross lesions.  No cervical or supraclavicular adenopathy. Thyroid normal.  Lungs:  Clear without wheezing, rales or rhonchi Cardiac: RR, without RMG Abdominal:  Soft, nontender, without masses, guarding, rebound, organomegaly or hernia Breasts:  Examined lying and sitting without masses, retractions, discharge or axillary adenopathy. Pelvic:  Ext, BUS, Vagina: Normal  Cervix: Normal  Uterus: Anteverted, normal size, shape and contour, midline and mobile nontender   Adnexa: Without masses or tenderness    Anus and perineum: Normal   Rectovaginal: Normal sphincter tone without palpated masses or tenderness.    Assessment/Plan:  42 y.o. G3P2 female for annual gynecologic exam with regular menses, condom contraception.   1. Fleeting cramping  to sharp stabbing pain lasting minutes occurring anywhere from 1-3 times weekly.  Left lower quadrant usually.  Exam is negative.  Differential reviewed.  Recently had normal urine analysis.  Pelvic exam is easy with no palpable abnormalities.  Most likely represents colonic spasm related to her constipation.  Offered ultrasound for ovarian surveillance but again does not sound like this and she declines ultrasound at this time.  Recommended she start on fiber based OTC products such as FiberCon or Metamucil with plenty of fluids.  Try to achieve a daily bowel movement.  If pain would persist despite this then will re-present for evaluation. 2. Mammography 04/2018.  Continue with annual mammography next year.  Breast exam normal today. 3. Pap smear/HPV 2016.  No Pap smear done today.  No history of significant abnormal Pap smears.  Plan repeat Pap smear/HPV at 5-year interval per current screening guidelines. 4. Contraception.  Using condoms which is acceptable to her.  Reviewed failure risk which is acceptable to her. 5. Health maintenance.  No routine lab work done as patient does this elsewhere.  Follow-up in 1 year, sooner as needed.   Anastasio Auerbach MD, 4:38 PM 05/19/2018

## 2018-05-19 NOTE — Patient Instructions (Signed)
Aloe up in 1 year for annual exam.

## 2019-02-10 ENCOUNTER — Telehealth: Payer: Self-pay | Admitting: Emergency Medicine

## 2019-02-10 NOTE — Telephone Encounter (Signed)
called pt LVM to

## 2019-02-15 ENCOUNTER — Emergency Department (HOSPITAL_BASED_OUTPATIENT_CLINIC_OR_DEPARTMENT_OTHER): Payer: BLUE CROSS/BLUE SHIELD

## 2019-02-15 ENCOUNTER — Ambulatory Visit: Payer: BLUE CROSS/BLUE SHIELD | Admitting: Emergency Medicine

## 2019-02-15 ENCOUNTER — Inpatient Hospital Stay (HOSPITAL_BASED_OUTPATIENT_CLINIC_OR_DEPARTMENT_OTHER)
Admission: AD | Admit: 2019-02-15 | Discharge: 2019-02-15 | Disposition: A | Discharge status: Home / Self Care | Payer: BLUE CROSS/BLUE SHIELD | Attending: Obstetrics and Gynecology | Admitting: Obstetrics and Gynecology

## 2019-02-15 ENCOUNTER — Other Ambulatory Visit: Payer: Self-pay

## 2019-02-15 ENCOUNTER — Inpatient Hospital Stay (HOSPITAL_COMMUNITY): Payer: BLUE CROSS/BLUE SHIELD | Admitting: Certified Registered Nurse Anesthetist

## 2019-02-15 ENCOUNTER — Encounter (HOSPITAL_COMMUNITY)
Admission: AD | Disposition: A | Discharge status: Home / Self Care | Payer: Self-pay | Source: Home / Self Care | Attending: Emergency Medicine

## 2019-02-15 ENCOUNTER — Encounter (HOSPITAL_BASED_OUTPATIENT_CLINIC_OR_DEPARTMENT_OTHER): Payer: Self-pay | Admitting: Emergency Medicine

## 2019-02-15 DIAGNOSIS — E039 Hypothyroidism, unspecified: Secondary | ICD-10-CM | POA: Diagnosis not present

## 2019-02-15 DIAGNOSIS — O00102 Left tubal pregnancy without intrauterine pregnancy: Secondary | ICD-10-CM | POA: Insufficient documentation

## 2019-02-15 DIAGNOSIS — O99280 Endocrine, nutritional and metabolic diseases complicating pregnancy, unspecified trimester: Secondary | ICD-10-CM | POA: Insufficient documentation

## 2019-02-15 DIAGNOSIS — Z79890 Hormone replacement therapy (postmenopausal): Secondary | ICD-10-CM | POA: Insufficient documentation

## 2019-02-15 DIAGNOSIS — O009 Unspecified ectopic pregnancy without intrauterine pregnancy: Secondary | ICD-10-CM | POA: Diagnosis present

## 2019-02-15 DIAGNOSIS — Z87891 Personal history of nicotine dependence: Secondary | ICD-10-CM | POA: Diagnosis not present

## 2019-02-15 HISTORY — PX: LAPAROSCOPIC LYSIS OF ADHESIONS: SHX5905

## 2019-02-15 HISTORY — PX: DIAGNOSTIC LAPAROSCOPY WITH REMOVAL OF ECTOPIC PREGNANCY: SHX6449

## 2019-02-15 HISTORY — DX: Hypothyroidism, unspecified: E03.9

## 2019-02-15 HISTORY — PX: LAPAROSCOPIC UNILATERAL SALPINGECTOMY: SHX5934

## 2019-02-15 LAB — TYPE AND SCREEN
ABO/RH(D): O POS
Antibody Screen: NEGATIVE

## 2019-02-15 LAB — CBC WITH DIFFERENTIAL/PLATELET
Abs Immature Granulocytes: 0.04 10*3/uL (ref 0.00–0.07)
Basophils Absolute: 0.1 10*3/uL (ref 0.0–0.1)
Basophils Relative: 1 %
Eosinophils Absolute: 0.1 10*3/uL (ref 0.0–0.5)
Eosinophils Relative: 1 %
HCT: 39.3 % (ref 36.0–46.0)
Hemoglobin: 12.8 g/dL (ref 12.0–15.0)
Immature Granulocytes: 0 %
Lymphocytes Relative: 13 %
Lymphs Abs: 1.7 10*3/uL (ref 0.7–4.0)
MCH: 30.7 pg (ref 26.0–34.0)
MCHC: 32.6 g/dL (ref 30.0–36.0)
MCV: 94.2 fL (ref 80.0–100.0)
Monocytes Absolute: 0.6 10*3/uL (ref 0.1–1.0)
Monocytes Relative: 4 %
Neutro Abs: 10.8 10*3/uL — ABNORMAL HIGH (ref 1.7–7.7)
Neutrophils Relative %: 81 %
Platelets: 310 10*3/uL (ref 150–400)
RBC: 4.17 MIL/uL (ref 3.87–5.11)
RDW: 13.2 % (ref 11.5–15.5)
WBC: 13.2 10*3/uL — ABNORMAL HIGH (ref 4.0–10.5)
nRBC: 0 % (ref 0.0–0.2)

## 2019-02-15 LAB — URINALYSIS, ROUTINE W REFLEX MICROSCOPIC
Bilirubin Urine: NEGATIVE
Glucose, UA: NEGATIVE mg/dL
Ketones, ur: NEGATIVE mg/dL
Leukocytes,Ua: NEGATIVE
Nitrite: NEGATIVE
Protein, ur: NEGATIVE mg/dL
Specific Gravity, Urine: <1.005 — ABNORMAL LOW (ref 1.005–1.030)
pH: 6.5 (ref 5.0–8.0)

## 2019-02-15 LAB — COMPREHENSIVE METABOLIC PANEL
ALT: 12 U/L (ref 0–44)
AST: 13 U/L — ABNORMAL LOW (ref 15–41)
Albumin: 4.2 g/dL (ref 3.5–5.0)
Alkaline Phosphatase: 47 U/L (ref 38–126)
Anion gap: 5 (ref 5–15)
BUN: 13 mg/dL (ref 6–20)
CO2: 24 mmol/L (ref 22–32)
Calcium: 9.1 mg/dL (ref 8.9–10.3)
Chloride: 108 mmol/L (ref 98–111)
Creatinine, Ser: 0.41 mg/dL — ABNORMAL LOW (ref 0.44–1.00)
GFR calc Af Amer: >60 mL/min (ref >60)
GFR calc non Af Amer: >60 mL/min (ref >60)
Glucose, Bld: 95 mg/dL (ref 70–99)
Potassium: 4 mmol/L (ref 3.5–5.1)
Sodium: 137 mmol/L (ref 135–145)
Total Bilirubin: 0.7 mg/dL (ref 0.3–1.2)
Total Protein: 7.4 g/dL (ref 6.5–8.1)

## 2019-02-15 LAB — PREGNANCY, URINE: Preg Test, Ur: POSITIVE — AB

## 2019-02-15 LAB — URINALYSIS, MICROSCOPIC (REFLEX): WBC, UA: NONE SEEN WBC/hpf (ref 0–5)

## 2019-02-15 LAB — HCG, QUANTITATIVE, PREGNANCY: hCG, Beta Chain, Quant, S: 188 m[IU]/mL — ABNORMAL HIGH (ref <5)

## 2019-02-15 SURGERY — LAPAROSCOPY, WITH ECTOPIC PREGNANCY SURGICAL TREATMENT
Anesthesia: General | Site: Abdomen

## 2019-02-15 MED ORDER — LACTATED RINGERS IV SOLN
INTRAVENOUS | Status: DC | PRN
  Administered 2019-02-15: 17:00:00 via INTRAVENOUS

## 2019-02-15 MED ORDER — BUPIVACAINE HCL (PF) 0.25 % IJ SOLN
INTRAMUSCULAR | Status: DC | PRN
  Administered 2019-02-15: 20 mL

## 2019-02-15 MED ORDER — ACETAMINOPHEN 10 MG/ML IV SOLN: 1000.0000 mg | Freq: Once | INTRAVENOUS | Status: DC | PRN

## 2019-02-15 MED ORDER — SODIUM CHLORIDE 0.9 % IR SOLN
Status: DC | PRN
  Administered 2019-02-15: 1000 mL

## 2019-02-15 MED ORDER — FENTANYL CITRATE (PF) 100 MCG/2ML IJ SOLN
INTRAMUSCULAR | Status: AC
  Filled 2019-02-15: qty 2

## 2019-02-15 MED ORDER — FENTANYL CITRATE (PF) 250 MCG/5ML IJ SOLN
INTRAMUSCULAR | Status: AC
  Filled 2019-02-15: qty 5

## 2019-02-15 MED ORDER — PHENYLEPHRINE 40 MCG/ML (10ML) SYRINGE FOR IV PUSH (FOR BLOOD PRESSURE SUPPORT)
PREFILLED_SYRINGE | INTRAVENOUS | Status: DC | PRN
  Administered 2019-02-15 (×4): 80 ug via INTRAVENOUS

## 2019-02-15 MED ORDER — MIDAZOLAM HCL 2 MG/2ML IJ SOLN
INTRAMUSCULAR | Status: AC
  Filled 2019-02-15: qty 2

## 2019-02-15 MED ORDER — ONDANSETRON HCL 4 MG/2ML IJ SOLN
INTRAMUSCULAR | Status: DC | PRN
  Administered 2019-02-15: 4 mg via INTRAVENOUS

## 2019-02-15 MED ORDER — LACTATED RINGERS IV SOLN
INTRAVENOUS | Status: DC
  Administered 2019-02-15: 17:00:00 via INTRAVENOUS

## 2019-02-15 MED ORDER — KETOROLAC TROMETHAMINE 30 MG/ML IJ SOLN
INTRAMUSCULAR | Status: AC
  Filled 2019-02-15: qty 1

## 2019-02-15 MED ORDER — SILVER NITRATE-POT NITRATE 75-25 % EX MISC
CUTANEOUS | Status: DC | PRN
  Administered 2019-02-15: 2

## 2019-02-15 MED ORDER — ROCURONIUM BROMIDE 50 MG/5ML IV SOSY
PREFILLED_SYRINGE | INTRAVENOUS | Status: DC | PRN
  Administered 2019-02-15: 40 mg via INTRAVENOUS
  Administered 2019-02-15: 10 mg via INTRAVENOUS

## 2019-02-15 MED ORDER — DEXAMETHASONE SODIUM PHOSPHATE 10 MG/ML IJ SOLN
INTRAMUSCULAR | Status: DC | PRN
  Administered 2019-02-15: 10 mg via INTRAVENOUS

## 2019-02-15 MED ORDER — FENTANYL CITRATE (PF) 100 MCG/2ML IJ SOLN
25.0000 ug | INTRAMUSCULAR | Status: DC | PRN
  Administered 2019-02-15: 25 ug via INTRAVENOUS

## 2019-02-15 MED ORDER — HYDROCODONE-ACETAMINOPHEN 5-325 MG PO TABS: 1.0000 | ORAL_TABLET | Freq: Once | ORAL | Status: DC

## 2019-02-15 MED ORDER — IBUPROFEN 600 MG PO TABS: 600.0000 mg | ORAL_TABLET | Freq: Four times a day (QID) | ORAL | 1 refills | Status: DC | PRN

## 2019-02-15 MED ORDER — KETOROLAC TROMETHAMINE 30 MG/ML IJ SOLN
INTRAMUSCULAR | Status: DC | PRN
  Administered 2019-02-15: 30 mg via INTRAVENOUS

## 2019-02-15 MED ORDER — PROPOFOL 10 MG/ML IV BOLUS
INTRAVENOUS | Status: AC
  Filled 2019-02-15: qty 20

## 2019-02-15 MED ORDER — MIDAZOLAM HCL 5 MG/5ML IJ SOLN
INTRAMUSCULAR | Status: DC | PRN
  Administered 2019-02-15: 2 mg via INTRAVENOUS

## 2019-02-15 MED ORDER — OXYCODONE HCL 5 MG PO TABS: 5.0000 mg | ORAL_TABLET | ORAL | 0 refills | Status: DC | PRN

## 2019-02-15 MED ORDER — SUCCINYLCHOLINE CHLORIDE 200 MG/10ML IV SOSY
PREFILLED_SYRINGE | INTRAVENOUS | Status: DC | PRN
  Administered 2019-02-15: 70 mg via INTRAVENOUS

## 2019-02-15 MED ORDER — SUGAMMADEX SODIUM 200 MG/2ML IV SOLN
INTRAVENOUS | Status: DC | PRN
  Administered 2019-02-15: 128.8 mg via INTRAVENOUS

## 2019-02-15 MED ORDER — FENTANYL CITRATE (PF) 100 MCG/2ML IJ SOLN
25.0000 ug | Freq: Once | INTRAMUSCULAR | Status: AC
  Administered 2019-02-15: 25 ug via INTRAVENOUS

## 2019-02-15 MED ORDER — OXYCODONE HCL 5 MG/5ML PO SOLN: 5.0000 mg | Freq: Once | ORAL | Status: AC | PRN

## 2019-02-15 MED ORDER — LIDOCAINE 2% (20 MG/ML) 5 ML SYRINGE
INTRAMUSCULAR | Status: DC | PRN
  Administered 2019-02-15: 60 mg via INTRAVENOUS

## 2019-02-15 MED ORDER — OXYCODONE HCL 5 MG PO TABS
5.0000 mg | ORAL_TABLET | Freq: Once | ORAL | Status: AC | PRN
  Administered 2019-02-15: 5 mg via ORAL

## 2019-02-15 MED ORDER — ACETAMINOPHEN 160 MG/5ML PO SOLN: 1000.0000 mg | Freq: Once | ORAL | Status: DC | PRN

## 2019-02-15 MED ORDER — PROPOFOL 10 MG/ML IV BOLUS
INTRAVENOUS | Status: DC | PRN
  Administered 2019-02-15: 140 mg via INTRAVENOUS

## 2019-02-15 MED ORDER — ACETAMINOPHEN 500 MG PO TABS: 1000.0000 mg | ORAL_TABLET | Freq: Once | ORAL | Status: DC | PRN

## 2019-02-15 MED ORDER — BUPIVACAINE HCL (PF) 0.25 % IJ SOLN
INTRAMUSCULAR | Status: AC
  Filled 2019-02-15: qty 30

## 2019-02-15 MED ORDER — FENTANYL CITRATE (PF) 100 MCG/2ML IJ SOLN
INTRAMUSCULAR | Status: DC | PRN
  Administered 2019-02-15 (×2): 50 ug via INTRAVENOUS
  Administered 2019-02-15: 150 ug via INTRAVENOUS

## 2019-02-15 MED ORDER — OXYCODONE HCL 5 MG PO TABS
ORAL_TABLET | ORAL | Status: AC
  Filled 2019-02-15: qty 1

## 2019-02-15 SURGICAL SUPPLY — 36 items
APL SRG 38 LTWT LNG FL B (MISCELLANEOUS)
APPLICATOR ARISTA FLEXITIP XL (MISCELLANEOUS) IMPLANT
BAG SPEC RTRVL LRG 6X4 10 (ENDOMECHANICALS) ×2
COVER WAND RF STERILE (DRAPES) ×3 IMPLANT
DRSG COVADERM PLUS 2X2 (GAUZE/BANDAGES/DRESSINGS) IMPLANT
DRSG OPSITE POSTOP 3X4 (GAUZE/BANDAGES/DRESSINGS) IMPLANT
DURAPREP 26ML APPLICATOR (WOUND CARE) ×3 IMPLANT
GLOVE BIOGEL PI IND STRL 6.5 (GLOVE) ×4 IMPLANT
GLOVE BIOGEL PI IND STRL 7.0 (GLOVE) ×4 IMPLANT
GLOVE BIOGEL PI INDICATOR 6.5 (GLOVE) ×2
GLOVE BIOGEL PI INDICATOR 7.0 (GLOVE) ×2
GLOVE ORTHOPEDIC STR SZ6.5 (GLOVE) ×3 IMPLANT
GOWN STRL REUS W/ TWL LRG LVL3 (GOWN DISPOSABLE) ×6 IMPLANT
GOWN STRL REUS W/TWL LRG LVL3 (GOWN DISPOSABLE) ×9
HEMOSTAT ARISTA ABSORB 3G PWDR (HEMOSTASIS) IMPLANT
KIT TURNOVER KIT B (KITS) ×3 IMPLANT
LIGASURE VESSEL 5MM BLUNT TIP (ELECTROSURGICAL) ×1 IMPLANT
NDL INSUFFLATION 14GA 120MM (NEEDLE) ×2 IMPLANT
NEEDLE INSUFFLATION 14GA 120MM (NEEDLE) ×3 IMPLANT
NS IRRIG 1000ML POUR BTL (IV SOLUTION) ×3 IMPLANT
PACK LAPAROSCOPY BASIN (CUSTOM PROCEDURE TRAY) ×3 IMPLANT
PACK TRENDGUARD 450 HYBRID PRO (MISCELLANEOUS) IMPLANT
POUCH SPECIMEN RETRIEVAL 10MM (ENDOMECHANICALS) ×1 IMPLANT
PROTECTOR NERVE ULNAR (MISCELLANEOUS) ×6 IMPLANT
SET IRRIG TUBING LAPAROSCOPIC (IRRIGATION / IRRIGATOR) ×1 IMPLANT
SET TUBE SMOKE EVAC HIGH FLOW (TUBING) ×3 IMPLANT
SUT MNCRL AB 4-0 PS2 18 (SUTURE) ×3 IMPLANT
SUT VICRYL 0 UR6 27IN ABS (SUTURE) ×3 IMPLANT
TOWEL GREEN STERILE FF (TOWEL DISPOSABLE) ×6 IMPLANT
TRAY FOLEY W/BAG SLVR 14FR (SET/KITS/TRAYS/PACK) ×3 IMPLANT
TRENDGUARD 450 HYBRID PRO PACK (MISCELLANEOUS)
TROCAR XCEL BLADELESS 5X75MML (TROCAR) ×1 IMPLANT
TROCAR XCEL NON-BLD 11X100MML (ENDOMECHANICALS) ×3 IMPLANT
TROCAR XCEL NON-BLD 5MMX100MML (ENDOMECHANICALS) ×3 IMPLANT
TROCAR XCEL OPT SLVE 5M 100M (ENDOMECHANICALS) ×3 IMPLANT
WARMER LAPAROSCOPE (MISCELLANEOUS) ×3 IMPLANT

## 2019-02-15 NOTE — Transfer of Care (Signed)
Immediate Anesthesia Transfer of Care Note  Patient: Brittney Pena  Procedure(s) Performed: DIAGNOSTIC LAPAROSCOPY WITH REMOVAL OF RUPTURED ECTOPIC PREGNANCY AND EVACUATION OF HEMOPERITONEUM (N/A Abdomen) Laparoscopic Unilateral Salpingectomy (Left Abdomen) Laparoscopic Lysis Of Adhesions (N/A Abdomen)  Patient Location: PACU  Anesthesia Type:General  Level of Consciousness: awake, alert  and oriented  Airway & Oxygen Therapy: Patient Spontanous Breathing  Post-op Assessment: Report given to RN and Post -op Vital signs reviewed and stable  Post vital signs: Reviewed and stable  Last Vitals:  Vitals Value Taken Time  BP 103/80 02/15/2019  6:55 PM  Temp    Pulse 89 02/15/2019  6:59 PM  Resp 13 02/15/2019  6:59 PM  SpO2 100 % 02/15/2019  6:59 PM  Vitals shown include unvalidated device data.  Last Pain:  Vitals:   02/15/19 1658  TempSrc:   PainSc: 3          Complications: No apparent anesthesia complications

## 2019-02-15 NOTE — Progress Notes (Signed)
I was contacted intraoperatively by Dr. Rosana Hoes to evaluate the sigmoid colon. The patient had been found to have a ruptured ectopic pregnancy and was taken to the OR for diagnostic laparoscopy and removal of associated tube. After she evaluated intraop, Dr. Rosana Hoes believes this to be a chronic rupture - the affected tube was abutting the sigmoid but easily separated without any significant retraction. There was a wad of heaped up gel-like tissue that appeared to be old black clot on the sigmoid that remained in situ after removal of the tube. I was contacted to evaluate. There were no concerns for injury or significant retraction on the bowel during mobilization of the tube as it separated with ease. At no point was mucosa seen or stool spillage. The wad of old clot appears to arise on the surface and would be explained by this being a chronic ruptured ectopic. There did not appear to be an associated sigmoid mass either. The mesosigmoid appeared normal. On my evaluation, there is no visible muscle or mucosa to suggest either a partial or full thickness injury.  We will remain available if questions or concerns arise - please contact us if we can be of additional assistance  Sharon Mt. Dema Severin, M.D. [facility]

## 2019-02-15 NOTE — H&P (Signed)
OB/GYN History and Physical  Brittney Pena is a 43 y.o. [id number] who was transferred from [facility] for concern for ruptured ectopic pregnancy. She had positive pregnancy test at home several weeks ago, scheduled appointment at a clinic for a termination. She started bleeding before her termination though, and at that appointment per patient, they did an ultrasound and told her she had a miscarriage. She went home and had occasional abdominal pain but it was not bad. Last night, started bleeding, and then this am, had significantly increased pain and came to hospital.       Past Medical History:  Diagnosis Date  . Thyroid disease     Past Surgical History:  Procedure Laterality Date  . BREAST BIOPSY Right    needle core biopsy, benign  . CESAREAN SECTION     x1    OB History  Gravida Para Term Preterm AB Living  6 2 2  0 2 2  SAB TAB Ectopic Multiple Live Births  1 1 0 0 2    # Outcome Date GA Lbr Len/2nd Weight Sex Delivery Anes PTL Lv  6 Current           5 Gravida           4 TAB           3 SAB           2 Term      CS-LTranv     1 Term             Social History   Socioeconomic History  . Marital status: Single    Spouse name: Not on file  . Number of children: 2  . Years of education: Not on file  . Highest education level: Not on file  Occupational History  . Not on file  Social Needs  . Financial resource strain: Not on file  . Food insecurity:    Worry: Not on file    Inability: Not on file  . Transportation needs:    Medical: Not on file    Non-medical: Not on file  Tobacco Use  . Smoking status: Former Smoker    Packs/day: 0.00  . Smokeless tobacco: Never Used  . Tobacco comment: refused to answer question   Substance and Sexual Activity  . Alcohol use: Yes    Alcohol/week: 0.0 standard drinks    Comment: Social  . Drug use: No  . Sexual activity: Yes    Partners: Male    Birth control/protection: Condom    Comment: 1st  intercourse 47 yo-1 partner  Lifestyle  . Physical activity:    Days per week: 5 days    Minutes per session: 60 min  . Stress: Only a little  Relationships  . Social connections:    Talks on phone: More than three times a week    Gets together: More than three times a week    Attends religious service: Never    Active member of club or organization: No    Attends meetings of clubs or organizations: Never    Relationship status: Divorced  Other Topics Concern  . Not on file  Social History Narrative   Pt is from [place]. She has lived in [place] for 10 years. Her 2 children live with her.     Family History  Problem Relation Age of Onset  . Prostate cancer Father   . Breast cancer Maternal Aunt 45    Medications Prior to Admission  Medication Sig Dispense Refill Last Dose  . levothyroxine (SYNTHROID, LEVOTHROID) 175 MCG tablet Take 1 tablet (175 mcg total) by mouth daily before breakfast. 90 tablet 1 Taking    No Known Allergies  Review of Systems: Negative except for what is mentioned in HPI.     Physical Exam: BP 116/67 (BP Location: Right Arm)   Pulse 71   Temp 98.2 F (36.8 C)   Resp 16   Ht 5\' 4"  (1.626 m)   Wt 64.4 kg   LMP 12/17/2018   SpO2 98%   BMI 24.37 kg/m  CONSTITUTIONAL: Well-developed, well-nourished female in mild distress.  HENT:  Normocephalic, atraumatic, External right and left ear normal. Oropharynx is clear and moist EYES: Conjunctivae and EOM are normal. Pupils are equal, round, and reactive to light. No scleral icterus.  NECK: Normal range of motion, supple, no masses SKIN: Skin is warm and dry. No rash noted. Not diaphoretic. No erythema. No pallor. [place]: Alert and oriented to person, place, and time. Normal reflexes, muscle tone coordination. No cranial nerve deficit noted. PSYCHIATRIC: Normal mood and affect. Normal behavior. Normal judgment and thought content. CARDIOVASCULAR: Normal heart rate noted, regular rhythm  RESPIRATORY: Effort normal, no problems with respiration noted ABDOMEN: Soft, moderately tender in LUQ, RLQ, significantly tender in LLQ PELVIC: Deferred MUSCULOSKELETAL: Normal range of motion. No edema and no tenderness. 2+ distal pulses.   Pertinent Labs/Studies:   CLINICAL DATA:  Intermittent vaginal bleeding and pelvic pain for the past 2 weeks. Estimated gestational age of [redacted] weeks, 2 days by LMP.  EXAM: OBSTETRIC <14 WK [place] AND TRANSVAGINAL OB US  TECHNIQUE: Both transabdominal and transvaginal ultrasound examinations were performed for complete evaluation of the gestation as well as the maternal uterus, adnexal regions, and pelvic cul-de-sac. Transvaginal technique was performed to assess early pregnancy.  COMPARISON:  None.  FINDINGS: Intrauterine gestational sac: None  Yolk sac:  Not Visualized.  Embryo:  Not Visualized.  Maternal uterus/adnexae: There is a 1.3 x 1.2 x 1.4 cm heterogeneous lesion in the left adnexa, inferior to the left ovary. The lesion contains a small central anechoic area. There is a small to moderate amount of complex free fluid in the pelvis.  The ovaries are unremarkable.  Left corpus luteum.  IMPRESSION: 1. 1.4 cm heterogeneous lesion in the left adnexa, inferior to the left ovary, with small to moderate amount of complex free fluid in the pelvis. No intrauterine gestational sac. Findings are concerning for ruptured ectopic pregnancy.  Critical Value/emergent results were called by telephone at the time of interpretation on 02/15/2019 at 1:28 pm to Dr. Gareth Morgan, who verbally acknowledged these results.   Electronically Signed   By: Titus Dubin M.D.   On: 02/15/2019 13:28     Assessment and Plan :Brittney Pena is a 43 y.o. [id number] who presents with abdominal pain. Exam with significant tenderness and concern for ruptured ectopic pregnancy on ultrasound based on left adnexal lesion and complex free fluid in  pelvis. Reviewed high suspicion for ruptured ectopic pregnancy and recommendation for surgical management given high suspicion for blood in abdomen and significant pain. Reviewed that pregnancy is likely in fallopian tube and will require removal of fallopian tube. Reviewed would remove only abnormal structures.   The risks of laparoscopic surgery were discussed with the patient including but not limited to: bleeding which may require transfusion or reoperation; infection which may require antibiotics; injury to bowel, bladder, ureters or other surrounding organs; need for additional procedures including laparotomy; thromboembolic  phenomenon, incisional problems and other postoperative/anesthesia complications. Patient verbalized understanding of the above and consent signed. Answered all questions.  She is agreeable to blood transfusion in the event of emergency. Reviewed expected post op course.    Plan for diagnostic laparoscopy, removal of ectopic pregnancy NPO since 7:30 am Admission labs ordered VS Q4 To OR when ready  Interview and consent done with spanish translator.    Feliz Beam, M.D. Attending [place], [facility] for [employer], [place]

## 2019-02-15 NOTE — ED Provider Notes (Signed)
[facility] EMERGENCY DEPARTMENT Provider Note   CSN: [id number] Arrival date & time: 02/15/19  1123    History   Chief Complaint Chief Complaint  Patient presents with   Possible ectopic pregnancy    HPI Brittney Pena is a 43 y.o. female.     The history is provided by the patient and medical records. A language interpreter was used (Spanish video interpreter).   Brittney Pena is a 43 y.o. female  with a PMH of thyroid disorder, dysfunctional uterine bleeding who presents to the Emergency Department complaining of lower abdominal pain.  Patient states that about 2 weeks ago, she took 4 home pregnancy tests all of which were positive.  She scheduled an appointment at a clinic for an abortion.  She feels as if she started to miscarry prior to that appointment.  She had some mild lower abdominal cramping and vaginal bleeding.  She still went to the appointment which was on Saturday, April 11.  She states they did a vaginal ultrasound and set her "uterus was clean".  They did do a pregnancy test and told her that it was negative.  They told her that she had miscarried and did not do any procedure at that appointment.  She went home assuming she had miscarried and felt as if she was getting better.  Few days later, she developed severe lower abdominal pain which would radiate around her flank and to her low back.  She then felt pressure in the vaginal area and started having heavy bleeding.  This lasted about 4 to 5 hours and then improved.  She states that this same constellation of symptoms happened again the following day and then again resolved.  This morning, the same symptoms occurred, which were much more severe than in the past for about 10 minutes.  She is still in pain now, but it is easing off drastically.  She denies any nausea or vomiting.  No fevers.  No urinary symptoms.  No diarrhea, constipation, blood in the stool or back pain.  Denies history of similar.   Past  Medical History:  Diagnosis Date   Thyroid disease     Patient Active Problem List   Diagnosis Date Noted   Cluster headache 04/11/2015   Galactorrhea 04/11/2015   DUB (dysfunctional uterine bleeding) 04/11/2015   Other specified hypothyroidism 04/11/2015    Past Surgical History:  Procedure Laterality Date   BREAST BIOPSY Right    needle core biopsy, benign   CESAREAN SECTION     x1     OB History    Gravida  5   Para  2   Term  2   Preterm  0   AB  2   Living  2     SAB  1   TAB  1   Ectopic  0   Multiple  0   Live Births  2            Home Medications    Prior to Admission medications   Medication Sig Start Date End Date Taking? Authorizing Provider  levothyroxine (SYNTHROID, LEVOTHROID) 175 MCG tablet Take 1 tablet (175 mcg total) by mouth daily before breakfast. 03/03/18   Leonie Douglas, PA-C    Family History Family History  Problem Relation Age of Onset   Prostate cancer Father    Breast cancer Maternal Aunt 35    Social History Social History   Tobacco Use   Smoking status: Former Smoker  Packs/day: 0.00   Smokeless tobacco: Never Used   Tobacco comment: refused to answer question   Substance Use Topics   Alcohol use: Yes    Alcohol/week: 0.0 standard drinks    Comment: Social   Drug use: No     Allergies   Patient has no known allergies.   Review of Systems Review of Systems  Gastrointestinal: Positive for abdominal pain. Negative for blood in stool, constipation, diarrhea, nausea and vomiting.  Genitourinary: Positive for vaginal bleeding and vaginal pain. Negative for dysuria, flank pain, frequency and vaginal discharge.  All other systems reviewed and are negative.    Physical Exam Updated Vital Signs BP 120/79 (BP Location: Right Arm)    Pulse 75    Temp 98.5 F (36.9 C) (Oral)    Resp 16    Ht 5\' 4"  (1.626 m)    Wt 64.4 kg    LMP 12/16/2018    SpO2 99%    BMI 24.37 kg/m   Physical  Exam Vitals signs and nursing note reviewed.  Constitutional:      General: She is not in acute distress.    Appearance: She is well-developed.  HENT:     Head: Normocephalic and atraumatic.  Neck:     Musculoskeletal: Neck supple.  Cardiovascular:     Rate and Rhythm: Normal rate and regular rhythm.     Heart sounds: Normal heart sounds. No murmur.  Pulmonary:     Effort: Pulmonary effort is normal. No respiratory distress.     Breath sounds: Normal breath sounds.  Abdominal:     General: There is no distension.     Palpations: Abdomen is soft.     Comments: Generalized abdominal tenderness, most severe to the lower abdomen. No CVA tenderness.  Skin:    General: Skin is warm and dry.  Neurological:     Mental Status: She is alert and oriented to person, place, and time.      ED Treatments / Results  Labs (all labs ordered are listed, but only abnormal results are displayed) Labs Reviewed  URINALYSIS, ROUTINE W REFLEX MICROSCOPIC - Abnormal; Notable for the following components:      Result Value   Specific Gravity, Urine <1.005 (*)    Hgb urine dipstick LARGE (*)    All other components within normal limits  PREGNANCY, URINE - Abnormal; Notable for the following components:   Preg Test, Ur POSITIVE (*)    All other components within normal limits  COMPREHENSIVE METABOLIC PANEL - Abnormal; Notable for the following components:   Creatinine, Ser 0.41 (*)    AST 13 (*)    All other components within normal limits  CBC WITH DIFFERENTIAL/PLATELET - Abnormal; Notable for the following components:   WBC 13.2 (*)    Neutro Abs 10.8 (*)    All other components within normal limits  HCG, QUANTITATIVE, PREGNANCY - Abnormal; Notable for the following components:   hCG, Beta Chain, Quant, S 188 (*)    All other components within normal limits  URINALYSIS, MICROSCOPIC (REFLEX) - Abnormal; Notable for the following components:   Bacteria, UA RARE (*)    All other components  within normal limits    EKG None  Radiology US Ob Comp < 14 Wks  Result Date: 02/15/2019 CLINICAL DATA:  Intermittent vaginal bleeding and pelvic pain for the past 2 weeks. Estimated gestational age of [redacted] weeks, 2 days by LMP. EXAM: OBSTETRIC <14 WK [place] AND TRANSVAGINAL OB US TECHNIQUE: Both transabdominal and  transvaginal ultrasound examinations were performed for complete evaluation of the gestation as well as the maternal uterus, adnexal regions, and pelvic cul-de-sac. Transvaginal technique was performed to assess early pregnancy. COMPARISON:  None. FINDINGS: Intrauterine gestational sac: None Yolk sac:  Not Visualized. Embryo:  Not Visualized. Maternal uterus/adnexae: There is a 1.3 x 1.2 x 1.4 cm heterogeneous lesion in the left adnexa, inferior to the left ovary. The lesion contains a small central anechoic area. There is a small to moderate amount of complex free fluid in the pelvis. The ovaries are unremarkable.  Left corpus luteum. IMPRESSION: 1. 1.4 cm heterogeneous lesion in the left adnexa, inferior to the left ovary, with small to moderate amount of complex free fluid in the pelvis. No intrauterine gestational sac. Findings are concerning for ruptured ectopic pregnancy. Critical Value/emergent results were called by telephone at the time of interpretation on 02/15/2019 at 1:28 pm to Dr. Gareth Morgan, who verbally acknowledged these results. Electronically Signed   By: Titus Dubin M.D.   On: 02/15/2019 13:28   US Ob Transvaginal  Result Date: 02/15/2019 CLINICAL DATA:  Intermittent vaginal bleeding and pelvic pain for the past 2 weeks. Estimated gestational age of [redacted] weeks, 2 days by LMP. EXAM: OBSTETRIC <14 WK [place] AND TRANSVAGINAL OB US TECHNIQUE: Both transabdominal and transvaginal ultrasound examinations were performed for complete evaluation of the gestation as well as the maternal uterus, adnexal regions, and pelvic cul-de-sac. Transvaginal technique was performed to assess early  pregnancy. COMPARISON:  None. FINDINGS: Intrauterine gestational sac: None Yolk sac:  Not Visualized. Embryo:  Not Visualized. Maternal uterus/adnexae: There is a 1.3 x 1.2 x 1.4 cm heterogeneous lesion in the left adnexa, inferior to the left ovary. The lesion contains a small central anechoic area. There is a small to moderate amount of complex free fluid in the pelvis. The ovaries are unremarkable.  Left corpus luteum. IMPRESSION: 1. 1.4 cm heterogeneous lesion in the left adnexa, inferior to the left ovary, with small to moderate amount of complex free fluid in the pelvis. No intrauterine gestational sac. Findings are concerning for ruptured ectopic pregnancy. Critical Value/emergent results were called by telephone at the time of interpretation on 02/15/2019 at 1:28 pm to Dr. Gareth Morgan, who verbally acknowledged these results. Electronically Signed   By: Titus Dubin M.D.   On: 02/15/2019 13:28    Procedures Procedures (including critical care time)  CRITICAL CARE Performed by: Ozella Almond Celvin Taney  Total critical care time: 40 minutes  Critical care time was exclusive of separately billable procedures and treating other patients.  Critical care was necessary to treat or prevent imminent or life-threatening deterioration. Ectopic pregnancy  Critical care was time spent personally by me on the following activities: development of treatment plan with patient and/or surrogate as well as nursing, discussions with consultants, evaluation of patient's response to treatment, examination of patient, obtaining history from patient or surrogate, ordering and performing treatments and interventions, ordering and review of laboratory studies, ordering and review of radiographic studies, pulse oximetry and re-evaluation of patient's condition.  Medications Ordered in ED Medications  fentaNYL (SUBLIMAZE) injection 25 mcg (has no administration in time range)  fentaNYL (SUBLIMAZE) 100 MCG/2ML  injection (has no administration in time range)     Initial Impression / Assessment and Plan / ED Course  I have reviewed the triage vital signs and the nursing notes.  Pertinent labs & imaging results that were available during my care of the patient were reviewed by me and considered in  my medical decision making (see chart for details).       Brittney Pena is a 43 y.o. female who presents to ED for vaginal bleeding and lower abdominal pain.  Unfortunately, cannot see any records from previous visits, but patient states that she was seen at an abortion clinic about a week and a half ago where she did have a transvaginal ultrasound and was told that she was likely miscarrying.  She had acute worsening of her pain a few days ago which has been coming and going.  Today, became much more severe with heavier vaginal bleeding, prompting her to seek emergency care.  On exam, she is afebrile and hemodynamically stable.  She has exquisitely tender abdomen with rebound.  Initial urine pregnancy test in triage positive.  Will obtain quant hCG.  Concerned for ectopic.  Ultrasound was obtained which is concerning for ruptured ectopic pregnancy on the left.  Patient was informed of these results using Spanish video interpreter.  We will transfer her to the MAU for emergent OB/GYN care.  I spoke with Dr. Ernestina Patches of OB/GYN who accepts patient in transfer.  Hemodynamically stable throughout the stay while in my care at this facility.  Patient discussed with Dr. Billy Fischer who agrees with treatment plan.    Final Clinical Impressions(s) / ED Diagnoses   Final diagnoses:  Ruptured ectopic pregnancy    ED Discharge Orders    None       Leasa Kincannon, Ozella Almond, PA-C 02/15/19 1407    Gareth Morgan, MD 02/15/19 2237

## 2019-02-15 NOTE — Discharge Instructions (Signed)
Laparoscopa de diagnstico, cuidados posteriores Diagnostic Laparoscopy, Care After Lea esta informacin sobre cmo cuidarse despus del procedimiento. El mdico tambin podr darle instrucciones ms especficas. Comunquese con su mdico si tiene problemas o preguntas. Qu puedo esperar despus del procedimiento? Despus del procedimiento, es comn [employer] siguientes sntomas:  Molestias abdominales leves.  Dolor de Investment banker, operational. Las mujeres que se somete a Furniture conservator/restorer laparoscopa con examen plvico pueden tener clicos leves y secrecin de lquido de la vagina durante unos das despus del procedimiento. Siga estas indicaciones en su casa: Medicamentos  [employer] de venta libre y los recetados solamente como se lo haya indicado el mdico.  Si le recetaron un antibitico, tmelo como se lo haya indicado el mdico. No deje de tomar los antibiticos aunque comience a Sports administrator. Conducir  No conduzca durante 24horas si recibi un medicamento para ayudarlo a relajarse (sedante) durante el procedimiento.  No conduzca ni use maquinaria pesada mientras toma analgsicos recetados. Baarse  No tome baos de inmersin, no nade ni use el jacuzzi hasta que el mdico lo autorice. Puede ducharse. Cuidados de la incisin   Siga las indicaciones del mdico acerca del cuidado de las incisiones. Haga lo siguiente: ? Lvese las manos con agua y jabn antes de cambiar la venda (vendaje). Use desinfectante para manos si no dispone de [place]. ? Cambie el vendaje como se lo haya indicado el mdico. ? No retire los puntos (suturas), la goma para cerrar la piel o las tiras [place]. Es posible que estos cierres cutneos Animal nutritionist en la piel durante 2semanas o ms tiempo. Si los bordes de las tiras adhesivas empiezan a despegarse y Therapist, sports, puede recortar los que estn sueltos. No retire las tiras [facility] por completo a menos que el mdico se lo indique.  [place] reas de la incisin para detectar signos de infeccin. Est atento a los siguientes signos: ? Dolor, hinchazn o enrojecimiento. ? Lquido o sangre. ? Calor. ? Pus o mal olor. Actividad  Retome sus actividades normales como se lo haya indicado el mdico. Pregntele al mdico qu actividades son seguras para usted.  No levante ningn objeto que pese ms de 10libras (4,5kg) o el lmite de peso que le indiquen hasta que el mdico le diga que puede [place]. Instrucciones generales  A fin de prevenir o tratar el estreimiento mientras toma analgsicos recetados, el mdico puede recomendarle lo siguiente: ? Beba suficiente lquido para mantener la orina de color amarillo plido. ? Tomar medicamentos recetados o de [employer]. ? Consumir alimentos ricos en fibra, como frutas y verduras frescas, cereales integrales y frijoles. ? Limitar el consumo de alimentos ricos en grasa y azcares procesados, como alimentos fritos o dulces.  No consuma ningn producto que contenga nicotina o tabaco, como cigarrillos y Psychologist, sport and exercise. Si necesita ayuda para dejar de fumar, consulte al mdico.  Concurra a todas las visitas de control como se lo haya indicado el mdico. Esto es importante. Comunquese con un mdico si:  Experimenta dolor de hombro.  Se siente mareado o se desmaya.  No puede eliminar los gases ni defecar.  Siente nuseas o vomita.  Presenta una erupcin cutnea.  Tiene enrojecimiento, hinchazn o dolor alrededor de alguna incisin.  Le sale lquido o sangre de alguna incisin.  Cualquiera de las incisiones est caliente al tacto.  Tiene pus o percibe que sale mal olor del lugar de alguna incisin.  Tiene fiebre o siente escalofros. Solicite ayuda de inmediato si:  Siente dolor intenso.  Tiene vmitos que no se calman.  Tiene una hemorragia por la vagina.  Alguna incisin se abre.  Tiene dificultad para respirar.  Siente dolor en el  pecho. Resumen  Despus del procedimiento, es frecuente sentir molestias leves en el abdomen y dolor de Investment banker, operational.  [place] reas de la incisin para detectar signos de infeccin.  Retome sus actividades normales como se lo haya indicado el mdico. Pregntele al mdico qu actividades son seguras para usted. Esta informacin no tiene Marine scientist el consejo del mdico. Asegrese de hacerle al mdico cualquier pregunta que tenga. Document Released: 02/03/2016 Document Revised: 07/19/2017 Document Reviewed: 07/19/2017 Elsevier Interactive Patient Education  2019 [employer].

## 2019-02-15 NOTE — Anesthesia Preprocedure Evaluation (Signed)
Anesthesia Evaluation  Patient identified by MRN, date of birth, ID band Patient awake    Reviewed: Allergy & Precautions, NPO status , Patient's Chart, lab work & pertinent test results  History of Anesthesia Complications Negative for: history of anesthetic complications  Airway Mallampati: II  TM Distance: >3 FB Neck ROM: Full    Dental  (+) Teeth Intact   Pulmonary former smoker,    breath sounds clear to auscultation       Cardiovascular negative cardio ROS   Rhythm:Regular     Neuro/Psych  Headaches, negative psych ROS   GI/Hepatic negative GI ROS, Neg liver ROS,   Endo/Other  Hypothyroidism   Renal/GU negative Renal ROS     Musculoskeletal negative musculoskeletal ROS (+)   Abdominal   Peds  Hematology negative hematology ROS (+)   Anesthesia Other Findings   Reproductive/Obstetrics ectopic                             Anesthesia Physical Anesthesia Plan  ASA: II  Anesthesia Plan: General   Post-op Pain Management:    Induction: Intravenous, Cricoid pressure planned and Rapid sequence  PONV Risk Score and Plan: 3 and Dexamethasone and Ondansetron  Airway Management Planned: Oral ETT  Additional Equipment: None  Intra-op Plan:   Post-operative Plan: Extubation in OR  Informed Consent: I have reviewed the patients History and Physical, chart, labs and discussed the procedure including the risks, benefits and alternatives for the proposed anesthesia with the patient or authorized representative who has indicated his/her understanding and acceptance.     Dental advisory given  Plan Discussed with: CRNA and Surgeon  Anesthesia Plan Comments:         Anesthesia Quick Evaluation

## 2019-02-15 NOTE — Anesthesia Postprocedure Evaluation (Signed)
Anesthesia Post Note  Patient: Brittney Pena  Procedure(s) Performed: DIAGNOSTIC LAPAROSCOPY WITH REMOVAL OF RUPTURED ECTOPIC PREGNANCY AND EVACUATION OF HEMOPERITONEUM (N/A Abdomen) Laparoscopic Unilateral Salpingectomy (Left Abdomen) Laparoscopic Lysis Of Adhesions (N/A Abdomen)     Patient location during evaluation: PACU Anesthesia Type: General Level of consciousness: sedated Pain management: pain level controlled Vital Signs Assessment: post-procedure vital signs reviewed and stable Respiratory status: spontaneous breathing and respiratory function stable Cardiovascular status: stable Postop Assessment: no apparent nausea or vomiting Anesthetic complications: no    Last Vitals:  Vitals:   02/15/19 1940 02/15/19 1955  BP: 113/71 109/70  Pulse: 75 80  Resp: 13 15  Temp:    SpO2: 99% 99%    Last Pain:  Vitals:   02/15/19 1658  TempSrc:   PainSc: 3                  Sayvon Arterberry DANIEL

## 2019-02-15 NOTE — Op Note (Signed)
Brittney Pena PROCEDURE DATE: 02/15/2019  PREOPERATIVE DIAGNOSES: suspected ruptured ectopic pregnancy POSTOPERATIVE DIAGNOSES: The same PROCEDURE: diagnostic laparoscopy, lysis of adhesions, left salpingectomy, evacuation of hemoperitoneum SURGEON:  Dr. Feliz Beam ASSISTANT: none ANESTHESIOLOGY TEAM: Anesthesiologist: Oleta Mouse, MD CRNA: Eligha Bridegroom, CRNA; Renato Shin, CRNA  INDICATIONS: 43 y.o. [phone] with aforementioned preoperative diagnoses here today for definitive surgical management.   Risks of surgery were discussed with the patient including but not limited to: bleeding which may require transfusion or reoperation; infection which may require antibiotics; injury to bowel, bladder, ureters or other surrounding organs; need for additional procedures including laparotomy; thromboembolic phenomenon, incisional problems and other postoperative/anesthesia complications. Written informed consent was obtained.    FINDINGS:  Dark red blood and clots in abdomen on entry, significant clots around left adnexa, normal appearing uterus, right fallopian tube and ovary, left ovary and tube adhered to sigmoid colon, left fallopian tube with distal bulge and bleeding near the fimbriae. Clot on sigmoid colon. Normal appearing appendix, otherwise normal appearing intestine and normal upper abdomen.   ANESTHESIA:  General anesthesia INTRAVENOUS FLUIDS: 1400 ml ESTIMATED BLOOD LOSS: 50 ml SPECIMENS:  Left fallopian tube with ectopic pregnancy COMPLICATIONS: None immediate   PROCEDURE IN DETAIL:  The patient had sequential compression devices applied to her lower extremities while in the preoperative area.  She was then taken to the operating room where general anesthesia was administered and was found to be adequate.  She was placed in the dorsal lithotomy position, and was prepped and draped in a sterile manner.  A Foley catheter was inserted into her bladder and attached to constant  drainage. A timeout was performed to verify patient and procedure. A uterine manipulator was then advanced into the uterus. Attention was then turned to the patient's abdomen where a 5-mm skin incision was made in the umbilical fold.    The varess needle was advanced without difficulty and the abdomen insufflated with carbon dioxide gas with an opening pressure of 3 mm. Pneumoperitoneum was obtained to 15 mm Hg and the varess needle was removed and the 5 mm Optiview port was advanced under visualization with the endoscope until the peritoneum was entered. The introducer was removed and the camera was replaced within the port to confirm intraperitoneum placement. The abdomen was visualized and survey of the abdomen showed atraumatic entry. Survey of the abdomen and pelvis were noted as above. A 5 mm port was placed into the right lower abdomen 2 cm proximal and median from the right ASIS after incision with a scalpel. The port was advanced under direct visualization. An 11 mm port was placed in the suprapubic area after a transverse incision was made with the scalpel. The port was advanced under direct visualization.   The patient was placed into trendelenburg and the left tube was grasped using an atraumatic grasper. Some adherence was noted from tube and ovary to sigmoid colon, gentle traction was placed onto the tube and ovary which were easily freed from the sigmoid colon and the area of rupture was easily visualized.  The fallopian tube was removed using a 5 mm Ligasure to ligate and transect along the mesosalpinx just inferior to the tube. Hemostasis noted at ligation site. The tube was placed into a 10 mm bag and removed from the abdomen. The pelvis was irrigated and suctioned.  The right tube and ovary were inspected and noted to be normal.   On closer inspection of the left pelvic side wall and sigmoid colon, dark  clot appearing tissue was adhered to both. This appeared to be somewhat inflammatory as  it did not easily separate from sidewall or sigmoid colon, this appeared to be a chronic ruptured ectopic, rather than an acute rupture from this morning. I had no concern for bowel injury, however, given appearance of clot adhered to the colon, I called for general surgery. Dr. Dema Severin came and visualized the colon with me. As there was no difficult dissection, no visualized injury and no evidence of stool spillage, no injury to bowel was suspected. He did not feel any further inspection was warranted and recommended leaving the clot to resorb. Please see his note for additional details.   The pelvis was again inspected and hemostasis noted at site of salpingectomy. The abdomen was again inspected and no bleeding or other abnormalities noted. At this point the procedure was completed and the trocars were removed from the abdomen. The pneumoperitoneum was removed as much as possible. The fascia of the 10 mm port was closed using 0-Vicryl. The skin of all ports was closed using 4-0 Monocryl. The uterine manipulator was removed and minimal bleeding at the site of the tenaculum that improved with pressure and silver nitrate. All instruments were removed from the vagina.   The patient was extubated without difficulty and taken to PACU in stable condition.   The patient will be discharged to home as per PACU criteria.  Routine postoperative instructions given.  She was prescribed oxycodone, Ibuprofen.  She will follow up in the clinic in 2 weeks for postoperative evaluation.   Feliz Beam, M.D. Attending Center for [employer] Fish farm manager)

## 2019-02-15 NOTE — Anesthesia Procedure Notes (Signed)
Procedure Name: Intubation Date/Time: 02/15/2019 5:24 PM Performed by: Renato Shin, CRNA Pre-anesthesia Checklist: Patient identified, Emergency Drugs available, Suction available and Patient being monitored Patient Re-evaluated:Patient Re-evaluated prior to induction Oxygen Delivery Method: Circle system utilized Preoxygenation: Pre-oxygenation with 100% oxygen Induction Type: IV induction and Rapid sequence Laryngoscope Size: Miller and 2 Grade View: Grade I Tube type: Oral Tube size: 7.0 mm Number of attempts: 1 Airway Equipment and Method: Stylet and Oral airway Placement Confirmation: ETT inserted through vocal cords under direct vision,  positive ETCO2 and breath sounds checked- equal and bilateral Secured at: 21 cm Tube secured with: Tape Dental Injury: Teeth and Oropharynx as per pre-operative assessment

## 2019-02-15 NOTE — ED Triage Notes (Signed)
Pt had done 4 home preg tests that were all positive.  Went to abortion clinic 2 weeks ago and she sts they told her "your uterus is clean" so they did not attempt an abortion, but she sts they also got a positive preg test.  Sts they told her it could possibly be an ectopic. After that she had bleeding for 1 week and it stopped.  Today she started having bleeding again and having a lot of low abd pain.  Went to a clinic at [facility] today and they advised she come here for an ultrasound.

## 2019-02-15 NOTE — MAU Note (Signed)
Patient transferred via [facility] from [facility] for a ruptured ectopic pregnancy.  Pt arrived in stable condition with VSS.

## 2019-02-16 ENCOUNTER — Telehealth: Payer: Self-pay | Admitting: Obstetrics and Gynecology

## 2019-02-16 ENCOUNTER — Encounter (HOSPITAL_COMMUNITY): Payer: Self-pay | Admitting: Obstetrics and Gynecology

## 2019-02-16 LAB — ABO/RH: ABO/RH(D): O POS

## 2019-02-16 NOTE — Telephone Encounter (Signed)
Called patient using spanish interpretor to review surgery and see how she is feeling, no answer, left voice mail, will try again.   Feliz Beam, M.D. Attending Center for [employer] farm manager)

## 2019-02-17 ENCOUNTER — Telehealth: Payer: Self-pay | Admitting: Obstetrics and Gynecology

## 2019-02-17 NOTE — Telephone Encounter (Signed)
Called patient to f/u after surgery. She is doing well, having some pain but controlled with medication. No nausea/vomiting, no issues with eating. Passing flatus, no BM yet. Reviewed it is okay to take laxative if needed. Still having some bleeding, reassured her this was normal. Denies fever/chills, other issues. Reviewed findings during surgery, answered all questions. Explained that we will do a post op visit. Reviewed she is to call with any issues and return to hospital with heavy bleeding, fever, pain.   Phone call done via Patent attorney.   Feliz Beam, M.D. Attending Center for [employer] farm manager)

## 2019-03-14 ENCOUNTER — Ambulatory Visit: Payer: BLUE CROSS/BLUE SHIELD | Admitting: Obstetrics and Gynecology

## 2019-03-14 ENCOUNTER — Encounter: Payer: Self-pay | Admitting: Obstetrics and Gynecology

## 2019-03-14 ENCOUNTER — Telehealth: Payer: Self-pay | Admitting: Obstetrics and Gynecology

## 2019-03-14 ENCOUNTER — Other Ambulatory Visit: Payer: Self-pay

## 2019-03-14 DIAGNOSIS — Z9889 Other specified postprocedural states: Secondary | ICD-10-CM

## 2019-03-14 NOTE — Progress Notes (Signed)
I attempted to connect with patient with Elita Quick #[id number] Spanish Interpreter @855am   LVM to call the office for visit.   I attempted to connect with patient with Posey Pronto # [phone] Spanish interpreter @903am  LVM to call office for visit today.

## 2019-03-14 NOTE — Progress Notes (Signed)
Patient called several times by RN, no answer.

## 2019-03-14 NOTE — Telephone Encounter (Signed)
Attempted to call patient to have her rescheduled for her post-op appointment. No answer, left voicemail to give the office a call back to be rescheduled. Letter mailed.

## 2019-05-22 ENCOUNTER — Encounter: Payer: BLUE CROSS/BLUE SHIELD | Admitting: Gynecology

## 2019-07-18 ENCOUNTER — Encounter: Payer: Self-pay | Admitting: Gynecology

## 2020-02-01 ENCOUNTER — Encounter: Payer: BLUE CROSS/BLUE SHIELD | Admitting: Adult Health Nurse Practitioner

## 2020-02-07 ENCOUNTER — Other Ambulatory Visit (HOSPITAL_COMMUNITY)
Admission: RE | Admit: 2020-02-07 | Discharge: 2020-02-07 | Disposition: A | Discharge status: Home / Self Care | Payer: 59 | Source: Ambulatory Visit | Attending: Adult Health Nurse Practitioner | Admitting: Adult Health Nurse Practitioner

## 2020-02-07 ENCOUNTER — Other Ambulatory Visit: Payer: Self-pay

## 2020-02-07 ENCOUNTER — Ambulatory Visit: Payer: BLUE CROSS/BLUE SHIELD | Admitting: Adult Health Nurse Practitioner

## 2020-02-07 VITALS — BP 115/74 | HR 79 | Temp 98.0°F | Ht 66.0 in | Wt 151.4 lb

## 2020-02-07 DIAGNOSIS — Z01419 Encounter for gynecological examination (general) (routine) without abnormal findings: Secondary | ICD-10-CM | POA: Diagnosis present

## 2020-02-07 DIAGNOSIS — N920 Excessive and frequent menstruation with regular cycle: Secondary | ICD-10-CM | POA: Diagnosis not present

## 2020-02-07 DIAGNOSIS — E039 Hypothyroidism, unspecified: Secondary | ICD-10-CM | POA: Diagnosis not present

## 2020-02-07 DIAGNOSIS — Z1231 Encounter for screening mammogram for malignant neoplasm of breast: Secondary | ICD-10-CM

## 2020-02-07 DIAGNOSIS — O009 Unspecified ectopic pregnancy without intrauterine pregnancy: Secondary | ICD-10-CM

## 2020-02-07 DIAGNOSIS — Z113 Encounter for screening for infections with a predominantly sexual mode of transmission: Secondary | ICD-10-CM | POA: Insufficient documentation

## 2020-02-07 DIAGNOSIS — Z13228 Encounter for screening for other metabolic disorders: Secondary | ICD-10-CM | POA: Diagnosis not present

## 2020-02-07 DIAGNOSIS — N938 Other specified abnormal uterine and vaginal bleeding: Secondary | ICD-10-CM

## 2020-02-07 LAB — POCT URINALYSIS DIP (MANUAL ENTRY)
Bilirubin, UA: NEGATIVE
Glucose, UA: NEGATIVE mg/dL
Ketones, POC UA: NEGATIVE mg/dL
Leukocytes, UA: NEGATIVE
Nitrite, UA: NEGATIVE
Protein Ur, POC: NEGATIVE mg/dL
Spec Grav, UA: 1.025 (ref 1.010–1.025)
Urobilinogen, UA: 0.2 E.U./dL
pH, UA: 8 (ref 5.0–8.0)

## 2020-02-07 LAB — POCT URINE PREGNANCY: Preg Test, Ur: NEGATIVE

## 2020-02-07 MED ORDER — LEVOTHYROXINE SODIUM 175 MCG PO TABS: 175.0000 ug | ORAL_TABLET | Freq: Every day | ORAL | 1 refills | Status: DC

## 2020-02-07 MED ORDER — METAMUCIL SMOOTH TEXTURE 58.6 % PO POWD: 1.0000 | Freq: Every day | ORAL | 12 refills | Status: AC

## 2020-02-07 NOTE — Progress Notes (Signed)
SUBJECTIVE:  44 y.o. female for annual routine Pap and checkup. Current Outpatient Medications  Medication Sig Dispense Refill  . levothyroxine (SYNTHROID) 175 MCG tablet Take 1 tablet (175 mcg total) by mouth daily before breakfast. 90 tablet 1  . ibuprofen (ADVIL) 600 MG tablet Take 1 tablet (600 mg total) by mouth every 6 (six) hours as needed. (Patient not taking: Reported on 02/07/2020) 30 tablet 1  . psyllium (METAMUCIL SMOOTH TEXTURE) 58.6 % powder Take 1 packet by mouth daily. 283 g 12   No current facility-administered medications for this visit.   Allergies: Patient has no known allergies.  No LMP recorded.  ROS:  Feeling well. No dyspnea or chest pain on exertion.  No abdominal pain, change in bowel habits, black or bloody stools.  No urinary tract symptoms. GYN ROS: she complains of pain to pelvic area since ectopic pregnancy  No bleeding with sex but does have Dysfunctional Uterine bleeding.  In addition, has not seen anyone for thyroid recently. No neurological complaints.  Also endorses alternating constipation and diarrhea.  Increased Gas.  Would like recommendations.    OBJECTIVE:  The patient appears well, alert, oriented x 3, in no distress. BP 115/74 (BP Location: Right Arm, Patient Position: Sitting, Cuff Size: Normal)   Pulse 79   Temp 98 F (36.7 C) (Temporal)   Ht 5\' 6"  (1.676 m)   Wt 151 lb 6.4 oz (68.7 kg)   SpO2 94%   BMI 24.44 kg/m  ENT normal.  Neck supple. No adenopathy or thyromegaly. PERLA. Lungs are clear, good air entry, no wheezes, rhonchi or rales. S1 and S2 normal, no murmurs, regular rate and rhythm. Abdomen soft without tenderness, guarding, mass or organomegaly. Extremities show no edema, normal peripheral pulses. Neurological is normal, no focal findings.  BREAST EXAM: breasts appear normal, no suspicious masses, no skin or nipple changes or axillary nodes  PELVIC EXAM: VULVA: normal appearing vulva with no masses, tenderness or lesions, VAGINA:  normal appearing vagina with normal color and discharge, no lesions, CERVIX: normal appearing cervix without discharge or lesions, cervical motion tenderness absent, UTERUS: uterus is normal size, shape, consistency and nontender, ADNEXA: tenderness left, no masses, PAP: Pap smear done today, DNA probe for chlamydia and GC obtained  ASSESSMENT:  well woman  PLAN:  mammogram pap smear return annually or prn

## 2020-02-07 NOTE — Patient Instructions (Addendum)
Health Maintenance, Female Adopting a healthy lifestyle and getting preventive care are important in promoting health and wellness. Ask your health care provider about:  The right schedule for you to have regular tests and exams.  Things you can do on your own to prevent diseases and keep yourself healthy. What should I know about diet, weight, and exercise? Eat a healthy diet   Eat a diet that includes plenty of vegetables, fruits, low-fat dairy products, and lean protein.  Do not eat a lot of foods that are high in solid fats, added sugars, or sodium. Maintain a healthy weight Body mass index (BMI) is used to identify weight problems. It estimates body fat based on height and weight. Your health care provider can help determine your BMI and help you achieve or maintain a healthy weight. Get regular exercise Get regular exercise. This is one of the most important things you can do for your health. Most adults should:  Exercise for at least 150 minutes each week. The exercise should increase your heart rate and make you sweat (moderate-intensity exercise).  Do strengthening exercises at least twice a week. This is in addition to the moderate-intensity exercise.  Spend less time sitting. Even light physical activity can be beneficial. Watch cholesterol and blood lipids Have your blood tested for lipids and cholesterol at 44 years of age, then have this test every 5 years. Have your cholesterol levels checked more often if:  Your lipid or cholesterol levels are high.  You are older than 44 years of age.  You are at high risk for heart disease. What should I know about cancer screening? Depending on your health history and family history, you may need to have cancer screening at various ages. This may include screening for:  Breast cancer.  Cervical cancer.  Colorectal cancer.  Skin cancer.  Lung cancer. What should I know about heart disease, diabetes, and high blood  pressure? Blood pressure and heart disease  High blood pressure causes heart disease and increases the risk of stroke. This is more likely to develop in people who have high blood pressure readings, are of African descent, or are overweight.  Have your blood pressure checked: ? Every 3-5 years if you are 18-39 years of age. ? Every year if you are 40 years old or older. Diabetes Have regular diabetes screenings. This checks your fasting blood sugar level. Have the screening done:  Once every three years after age 40 if you are at a normal weight and have a low risk for diabetes.  More often and at a younger age if you are overweight or have a high risk for diabetes. What should I know about preventing infection? Hepatitis B If you have a higher risk for hepatitis B, you should be screened for this virus. Talk with your health care provider to find out if you are at risk for hepatitis B infection. Hepatitis C Testing is recommended for:  Everyone born from 1945 through 1965.  Anyone with known risk factors for hepatitis C. Sexually transmitted infections (STIs)  Get screened for STIs, including gonorrhea and chlamydia, if: ? You are sexually active and are younger than 44 years of age. ? You are older than 44 years of age and your health care provider tells you that you are at risk for this type of infection. ? Your sexual activity has changed since you were last screened, and you are at increased risk for chlamydia or gonorrhea. Ask your health care provider if   you are at risk.  Ask your health care provider about whether you are at high risk for HIV. Your health care provider may recommend a prescription medicine to help prevent HIV infection. If you choose to take medicine to prevent HIV, you should first get tested for HIV. You should then be tested every 3 months for as long as you are taking the medicine. Pregnancy  If you are about to stop having your period (premenopausal) and  you may become pregnant, seek counseling before you get pregnant.  Take 400 to 800 micrograms (mcg) of folic acid every day if you become pregnant.  Ask for birth control (contraception) if you want to prevent pregnancy. Osteoporosis and menopause Osteoporosis is a disease in which the bones lose minerals and strength with aging. This can result in bone fractures. If you are 65 years old or older, or if you are at risk for osteoporosis and fractures, ask your health care provider if you should:  Be screened for bone loss.  Take a calcium or vitamin D supplement to lower your risk of fractures.  Be given hormone replacement therapy (HRT) to treat symptoms of menopause. Follow these instructions at home: Lifestyle  Do not use any products that contain nicotine or tobacco, such as cigarettes, e-cigarettes, and chewing tobacco. If you need help quitting, ask your health care provider.  Do not use street drugs.  Do not share needles.  Ask your health care provider for help if you need support or information about quitting drugs. Alcohol use  Do not drink alcohol if: ? Your health care provider tells you not to drink. ? You are pregnant, may be pregnant, or are planning to become pregnant.  If you drink alcohol: ? Limit how much you use to 0-1 drink a day. ? Limit intake if you are breastfeeding.  Be aware of how much alcohol is in your drink. In the [place] one drink equals one 12 oz bottle of beer (355 mL), one 5 oz glass of wine (148 mL), or one 1 oz glass of hard liquor (44 mL). General instructions  Schedule regular health, dental, and eye exams.  Stay current with your vaccines.  Tell your health care provider if: ? You often feel depressed. ? You have ever been abused or do not feel safe at home. Summary  Adopting a healthy lifestyle and getting preventive care are important in promoting health and wellness.  Follow your health care provider's instructions about healthy  diet, exercising, and getting tested or screened for diseases.  Follow your health care provider's instructions on monitoring your cholesterol and blood pressure. This information is not intended to replace advice given to you by your health care provider. Make sure you discuss any questions you have with your health care provider. Document Revised: 10/05/2018 Document Reviewed: 10/05/2018 Elsevier Patient Education  2020 Elsevier Inc.  

## 2020-02-08 LAB — CMP14+EGFR
ALT: 15 IU/L (ref 0–32)
AST: 17 IU/L (ref 0–40)
Albumin/Globulin Ratio: 1.7 (ref 1.2–2.2)
Albumin: 4.6 g/dL (ref 3.8–4.8)
Alkaline Phosphatase: 56 IU/L (ref 39–117)
BUN/Creatinine Ratio: 22 (ref 9–23)
BUN: 14 mg/dL (ref 6–24)
Bilirubin Total: 0.2 mg/dL (ref 0.0–1.2)
CO2: 22 mmol/L (ref 20–29)
Calcium: 9.7 mg/dL (ref 8.7–10.2)
Chloride: 107 mmol/L — ABNORMAL HIGH (ref 96–106)
Creatinine, Ser: 0.65 mg/dL (ref 0.57–1.00)
GFR calc Af Amer: 126 mL/min/{1.73_m2} (ref >59)
GFR calc non Af Amer: 109 mL/min/{1.73_m2} (ref >59)
Globulin, Total: 2.7 g/dL (ref 1.5–4.5)
Glucose: 78 mg/dL (ref 65–99)
Potassium: 4.5 mmol/L (ref 3.5–5.2)
Sodium: 143 mmol/L (ref 134–144)
Total Protein: 7.3 g/dL (ref 6.0–8.5)

## 2020-02-08 LAB — CBC WITH DIFFERENTIAL/PLATELET
Basophils Absolute: 0.1 10*3/uL (ref 0.0–0.2)
Basos: 1 %
EOS (ABSOLUTE): 0.2 10*3/uL (ref 0.0–0.4)
Eos: 3 %
Hematocrit: 40.2 % (ref 34.0–46.6)
Hemoglobin: 13.5 g/dL (ref 11.1–15.9)
Immature Grans (Abs): 0 10*3/uL (ref 0.0–0.1)
Immature Granulocytes: 0 %
Lymphocytes Absolute: 2.3 10*3/uL (ref 0.7–3.1)
Lymphs: 29 %
MCH: 31.8 pg (ref 26.6–33.0)
MCHC: 33.6 g/dL (ref 31.5–35.7)
MCV: 95 fL (ref 79–97)
Monocytes Absolute: 0.7 10*3/uL (ref 0.1–0.9)
Monocytes: 8 %
Neutrophils Absolute: 4.8 10*3/uL (ref 1.4–7.0)
Neutrophils: 59 %
Platelets: 264 10*3/uL (ref 150–450)
RBC: 4.25 x10E6/uL (ref 3.77–5.28)
RDW: 12.3 % (ref 11.7–15.4)
WBC: 8.1 10*3/uL (ref 3.4–10.8)

## 2020-02-08 LAB — THYROID PANEL WITH TSH
Free Thyroxine Index: 1.7 (ref 1.2–4.9)
T3 Uptake Ratio: 27 % (ref 24–39)
T4, Total: 6.3 ug/dL (ref 4.5–12.0)
TSH: 13.7 u[IU]/mL — ABNORMAL HIGH (ref 0.450–4.500)

## 2020-02-12 LAB — CYTOLOGY - PAP
Adequacy: ABSENT
Chlamydia: NEGATIVE
Comment: NEGATIVE
Comment: NEGATIVE
Comment: NORMAL
Diagnosis: NEGATIVE
Neisseria Gonorrhea: NEGATIVE
Trichomonas: NEGATIVE

## 2020-02-26 ENCOUNTER — Other Ambulatory Visit: Payer: 59

## 2020-02-29 ENCOUNTER — Ambulatory Visit
Admission: RE | Admit: 2020-02-29 | Discharge: 2020-02-29 | Disposition: A | Discharge status: Home / Self Care | Payer: 59 | Source: Ambulatory Visit | Attending: Adult Health Nurse Practitioner | Admitting: Adult Health Nurse Practitioner

## 2020-02-29 DIAGNOSIS — O009 Unspecified ectopic pregnancy without intrauterine pregnancy: Secondary | ICD-10-CM

## 2020-02-29 DIAGNOSIS — N938 Other specified abnormal uterine and vaginal bleeding: Secondary | ICD-10-CM

## 2020-03-06 NOTE — Progress Notes (Signed)
Left message for patient to take thyroid medication and repeat thyroid panel in 6-9 weeks.

## 2020-03-07 ENCOUNTER — Other Ambulatory Visit: Payer: Self-pay | Admitting: Adult Health Nurse Practitioner

## 2020-03-07 DIAGNOSIS — R9389 Abnormal findings on diagnostic imaging of other specified body structures: Secondary | ICD-10-CM

## 2020-03-07 DIAGNOSIS — N920 Excessive and frequent menstruation with regular cycle: Secondary | ICD-10-CM

## 2020-03-19 ENCOUNTER — Ambulatory Visit (INDEPENDENT_AMBULATORY_CARE_PROVIDER_SITE_OTHER): Payer: 59 | Admitting: Obstetrics and Gynecology

## 2020-03-19 ENCOUNTER — Other Ambulatory Visit: Payer: Self-pay

## 2020-03-19 ENCOUNTER — Encounter: Payer: Self-pay | Admitting: Obstetrics and Gynecology

## 2020-03-19 VITALS — BP 120/66 | Ht 64.25 in | Wt 150.4 lb

## 2020-03-19 DIAGNOSIS — N83201 Unspecified ovarian cyst, right side: Secondary | ICD-10-CM | POA: Diagnosis not present

## 2020-03-19 DIAGNOSIS — Z13220 Encounter for screening for lipoid disorders: Secondary | ICD-10-CM

## 2020-03-19 DIAGNOSIS — Z3009 Encounter for other general counseling and advice on contraception: Secondary | ICD-10-CM | POA: Diagnosis not present

## 2020-03-19 DIAGNOSIS — Z01419 Encounter for gynecological examination (general) (routine) without abnormal findings: Secondary | ICD-10-CM | POA: Diagnosis not present

## 2020-03-19 DIAGNOSIS — R1032 Left lower quadrant pain: Secondary | ICD-10-CM

## 2020-03-19 DIAGNOSIS — N949 Unspecified condition associated with female genital organs and menstrual cycle: Secondary | ICD-10-CM | POA: Diagnosis not present

## 2020-03-19 NOTE — Patient Instructions (Signed)
Endometriosis  Endometriosis is a condition in which the tissue that lines the uterus (endometrium) grows outside of its normal location. The tissue may grow in many locations close to the uterus, but it commonly grows on the ovaries, fallopian tubes, vagina, or bowel. When the uterus sheds the endometrium every menstrual cycle, there is bleeding wherever the endometrial tissue is located. This can cause pain because blood is irritating to tissues that are not normally exposed to it. What are the causes? The cause of endometriosis is not known. What increases the risk? You may be more likely to develop endometriosis if you:  Have a family history of endometriosis.  Have never given birth.  Started your period at age 10 or younger.  Have high levels of estrogen in your body.  Were exposed to a certain medicine (diethylstilbestrol) before you were born (in utero).  Had low birth weight.  Were born as a twin, triplet, or other multiple.  Have a BMI of less than 25. BMI is an estimate of body fat and is calculated from height and weight. What are the signs or symptoms? Often, there are no symptoms of this condition. If you do have symptoms, they may:  Vary depending on where your endometrial tissue is growing.  Occur during your menstrual period (most common) or midcycle.  Come and go, or you may go months with no symptoms at all.  Stop with menopause. Symptoms may include:  Pain in the back or abdomen.  Heavier bleeding during periods.  Pain during sex.  Painful bowel movements.  Infertility.  Pelvic pain.  Bleeding more than once a month. How is this diagnosed? This condition is diagnosed based on your symptoms and a physical exam. You may have tests, such as:  Blood tests and urine tests. These may be done to help rule out other possible causes of your symptoms.  Ultrasound, to look for abnormal tissues.  An X-ray of the lower bowel (barium enema).  An  ultrasound that is done through the vagina (transvaginally).  CT scan.  MRI.  Laparoscopy. In this procedure, a lighted, pencil-sized instrument called a laparoscope is inserted into your abdomen through an incision. The laparoscope allows your health care provider to look at the organs inside your body and check for abnormal tissue to confirm the diagnosis. If abnormal tissue is found, your health care provider may remove a small piece of tissue (biopsy) to be examined under a microscope. How is this treated? Treatment for this condition may include:  Medicines to relieve pain, such as NSAIDs.  Hormone therapy. This involves using artificial (synthetic) hormones to reduce endometrial tissue growth. Your health care provider may recommend using a hormonal form of birth control, or other medicines.  Surgery. This may be done to remove abnormal endometrial tissue. ? In some cases, tissue may be removed using a laparoscope and a laser (laparoscopic laser treatment). ? In severe cases, surgery may be done to remove the fallopian tubes, uterus, and ovaries (hysterectomy). Follow these instructions at home:  Take over-the-counter and prescription medicines only as told by your health care provider.  Do not drive or use heavy machinery while taking prescription pain medicine.  Try to avoid activities that cause pain, including sexual activity.  Keep all follow-up visits as told by your health care provider. This is important. Contact a health care provider if:  You have pain in the area between your hip bones (pelvic area) that occurs: ? Before, during, or after your period. ?   In between your period and gets worse during your period. ? During or after sex. ? With bowel movements or urination, especially during your period.  You have problems getting pregnant.  You have a fever. Get help right away if:  You have severe pain that does not get better with medicine.  You have severe  nausea and vomiting, or you cannot eat without vomiting.  You have pain that affects only the lower, right side of your abdomen.  You have abdominal pain that gets worse.  You have abdominal swelling.  You have blood in your stool. This information is not intended to replace advice given to you by your health care provider. Make sure you discuss any questions you have with your health care provider. Document Revised: 09/24/2017 Document Reviewed: 03/14/2016 Elsevier Patient Education  2020 Elsevier Inc.  

## 2020-03-19 NOTE — Progress Notes (Signed)
Brittney Pena September 07, 1976 [id number]  SUBJECTIVE:  44 y.o. [id number] female for annual routine gynecologic exam. Last seen here in 04/2018. She is having left pelvic pain and was seen by her primary care provider and a pelvic ultrasound was performed 02/29/2020.  Main finding was heterogenous endometrium with suggestion of adenomyosis.  Focal lesions, no fibroids.  Uterus normal size 9.1 x 5.8 x 6.7 cm.  Left ovary appeared normal, right ovary had a 2.9 cm mildly complex cyst.  She describes the pelvic pain as sporadic throughout the month, but it always gets a little worse for the day before her period.  No radiation of the pain.  No pain with defecation, denies constipation.  No dyspareunia.  She is sexually active with her boyfriend and not currently using any contraception.  She did have a ruptured ectopic pregnancy last year 01/2019 resolved by laparoscopic left salpingectomy with evacuation of hemoperitoneum.  During that surgery general surgery was called in to evaluate the sigmoid colon and and it was deemed grossly normal during inspection per the notes.  Current Outpatient Medications  Medication Sig Dispense Refill  . levothyroxine (SYNTHROID) 175 MCG tablet Take 1 tablet (175 mcg total) by mouth daily before breakfast. 90 tablet 1   No current facility-administered medications for this visit.   Allergies: Patient has no known allergies.  Patient's last menstrual period was 03/12/2020 (exact date).  Past medical history,surgical history, problem list, medications, allergies, family history and social history were all reviewed and documented as reviewed in the EPIC chart.  ROS:  Feeling well. No dyspnea or chest pain on exertion.  No abdominal pain, change in bowel habits, black or bloody stools.  No urinary tract symptoms. GYN ROS: normal menses, no abnormal bleeding, + left pelvic pain, no discharge, no breast pain or new or enlarging lumps on self exam. No neurological  complaints.   OBJECTIVE:  BP 120/66   Ht 5' 4.25" (1.632 m)   Wt 150 lb 6.4 oz (68.2 kg)   LMP 03/12/2020 (Exact Date)   BMI 25.62 kg/m  The patient appears well, alert, oriented x 3, in no distress. ENT normal.  Neck supple. No cervical or supraclavicular adenopathy or thyromegaly.  Lungs are clear, good air entry, no wheezes, rhonchi or rales. S1 and S2 normal, no murmurs, regular rate and rhythm.  Abdomen soft without tenderness, guarding, mass or organomegaly.  Neurological is normal, no focal findings.  BREAST EXAM: breasts appear normal, no suspicious masses, no skin or nipple changes or axillary nodes  PELVIC EXAM: VULVA: normal appearing vulva with no masses, tenderness or lesions, VAGINA: normal appearing vagina with normal color and discharge, no lesions, CERVIX: normal appearing cervix without discharge or lesions, UTERUS: uterus is normal size, shape, consistency, uterus is quite tender during the examination, ADNEXA: Mild left adnexal tenderness, no masses, right adnexa nontender  Chaperone: Caryn Bee present during the examination  ASSESSMENT:  44 y.o. [id number] here for annual gynecologic exam  PLAN:   1. No hormonal or menstrual concerns.  2. Pap smear 01/2020.   No significant history of abnormal Pap smears.  Next Pap smear due 2024 following the current guidelines recommending the 3 year interval. 3. Contraception.  None currently.  She just finished her period.  She is not trying to get pregnant but not proactively trying to avoid it either.  History of ectopic pregnancy noted last year.  She is encouraged to use effective contraception and we discussed various options today including the IUD, and she  did have some interest in possibly trying the Mirena IUD.  I provided her with printed IUD information at the end of the visit at checkout. 4. LLQ pain.  History of left ectopic pregnancy noted.  Tenderness over uterus and left adnexa on examination today.  Recent pelvic  ultrasound findings indicate heterogenous endometrium and visual appearance that would be suggestive of possible adenomyosis, which we discussed today.  Her pelvic examination is consistent with adenomyosis with uterine tenderness noted.  I highly encouraged her to consider hormonal contraception as this would reduce her symptoms if they are related to adenomyosis/endometriosis.  I provided her with printed information on endometriosis at the end of the visit. 5. Right ovarian cyst, 2.9 cm, with complex appearance on recent ultrasound.  Recommend repeat ultrasound in 3 months, which is ordered. 6. Mammogram 2019.  Breast exam normal.  She is strongly encouraged to schedule an annual routine screening mammogram as this is overdue. 7. Health maintenance.  TSH, CBC and CMP recently performed with primary care provider, lipid panel is ordered if she wishes to complete this.  Return annually or sooner, prn.  Joseph Pierini MD 03/19/20

## 2020-05-13 ENCOUNTER — Encounter (HOSPITAL_COMMUNITY): Payer: Self-pay | Admitting: Emergency Medicine

## 2020-05-13 ENCOUNTER — Emergency Department (HOSPITAL_COMMUNITY)
Admission: EM | Admit: 2020-05-13 | Discharge: 2020-05-14 | Disposition: A | Discharge status: Home / Self Care | Payer: 59 | Attending: Emergency Medicine | Admitting: Emergency Medicine

## 2020-05-13 ENCOUNTER — Other Ambulatory Visit: Payer: Self-pay

## 2020-05-13 DIAGNOSIS — Z5321 Procedure and treatment not carried out due to patient leaving prior to being seen by health care provider: Secondary | ICD-10-CM | POA: Diagnosis not present

## 2020-05-13 DIAGNOSIS — K5792 Diverticulitis of intestine, part unspecified, without perforation or abscess without bleeding: Secondary | ICD-10-CM | POA: Diagnosis not present

## 2020-05-13 DIAGNOSIS — R109 Unspecified abdominal pain: Secondary | ICD-10-CM | POA: Insufficient documentation

## 2020-05-13 LAB — URINALYSIS, ROUTINE W REFLEX MICROSCOPIC
Bacteria, UA: NONE SEEN
Bilirubin Urine: NEGATIVE
Glucose, UA: NEGATIVE mg/dL
Ketones, ur: NEGATIVE mg/dL
Leukocytes,Ua: NEGATIVE
Nitrite: NEGATIVE
Protein, ur: NEGATIVE mg/dL
Specific Gravity, Urine: 1.014 (ref 1.005–1.030)
pH: 7 (ref 5.0–8.0)

## 2020-05-13 LAB — COMPREHENSIVE METABOLIC PANEL
ALT: 14 U/L (ref 0–44)
AST: 18 U/L (ref 15–41)
Albumin: 4.2 g/dL (ref 3.5–5.0)
Alkaline Phosphatase: 46 U/L (ref 38–126)
Anion gap: 9 (ref 5–15)
BUN: 12 mg/dL (ref 6–20)
CO2: 24 mmol/L (ref 22–32)
Calcium: 9.5 mg/dL (ref 8.9–10.3)
Chloride: 106 mmol/L (ref 98–111)
Creatinine, Ser: 0.6 mg/dL (ref 0.44–1.00)
GFR calc Af Amer: >60 mL/min (ref >60)
GFR calc non Af Amer: >60 mL/min (ref >60)
Glucose, Bld: 104 mg/dL — ABNORMAL HIGH (ref 70–99)
Potassium: 3.7 mmol/L (ref 3.5–5.1)
Sodium: 139 mmol/L (ref 135–145)
Total Bilirubin: 0.7 mg/dL (ref 0.3–1.2)
Total Protein: 7.2 g/dL (ref 6.5–8.1)

## 2020-05-13 LAB — I-STAT BETA HCG BLOOD, ED (MC, WL, AP ONLY): I-stat hCG, quantitative: <5 m[IU]/mL (ref <5)

## 2020-05-13 LAB — CBC
HCT: 41.2 % (ref 36.0–46.0)
Hemoglobin: 13.4 g/dL (ref 12.0–15.0)
MCH: 29.8 pg (ref 26.0–34.0)
MCHC: 32.5 g/dL (ref 30.0–36.0)
MCV: 91.8 fL (ref 80.0–100.0)
Platelets: 289 10*3/uL (ref 150–400)
RBC: 4.49 MIL/uL (ref 3.87–5.11)
RDW: 13.5 % (ref 11.5–15.5)
WBC: 7.4 10*3/uL (ref 4.0–10.5)
nRBC: 0 % (ref 0.0–0.2)

## 2020-05-13 LAB — LIPASE, BLOOD: Lipase: 23 U/L (ref 11–51)

## 2020-05-13 MED ORDER — SODIUM CHLORIDE 0.9% FLUSH: 3.0000 mL | Freq: Once | INTRAVENOUS | Status: DC

## 2020-05-13 NOTE — ED Triage Notes (Signed)
Spanish speaker pt c/o abd pain for the past 3 days no nausea or vomiting, pt states she has hx of diverticulitis.

## 2020-05-13 NOTE — ED Notes (Signed)
Pt stated that she could not wait any longer and was leaving.

## 2020-05-15 ENCOUNTER — Telehealth: Payer: Self-pay | Admitting: Adult Health Nurse Practitioner

## 2020-05-15 NOTE — Telephone Encounter (Signed)
Attempted to call pt back to see what we can do for her. No answer so I left a message to call back.

## 2020-05-15 NOTE — Telephone Encounter (Signed)
Pt called and stated she revived a call from our office and is just returning that call. Please advise.

## 2020-07-16 ENCOUNTER — Ambulatory Visit: Payer: 59 | Admitting: Emergency Medicine

## 2020-07-16 ENCOUNTER — Other Ambulatory Visit: Payer: Self-pay

## 2020-07-16 ENCOUNTER — Encounter: Payer: Self-pay | Admitting: Emergency Medicine

## 2020-07-16 VITALS — BP 107/65 | HR 72 | Temp 98.1°F | Resp 16 | Ht 66.0 in | Wt 152.0 lb

## 2020-07-16 DIAGNOSIS — R1032 Left lower quadrant pain: Secondary | ICD-10-CM | POA: Diagnosis not present

## 2020-07-16 DIAGNOSIS — K625 Hemorrhage of anus and rectum: Secondary | ICD-10-CM | POA: Diagnosis not present

## 2020-07-16 LAB — POC HEMOCCULT BLD/STL (OFFICE/1-CARD/DIAGNOSTIC): Fecal Occult Blood, POC: NEGATIVE

## 2020-07-16 NOTE — Patient Instructions (Addendum)
     If you have lab work done today you will be contacted with your lab results within the next 2 weeks.  If you have not heard from [place] then please contact us. The fastest way to get your results is to register for My Chart.   IF you received an x-ray today, you will receive an invoice from [facility]. Please contact [facility] at [phone] with questions or concerns regarding your invoice.   IF you received labwork today, you will receive an invoice from [facility]. Please contact [facility] at [phone] with questions or concerns regarding your invoice.   Our billing staff will not be able to assist you with questions regarding bills from these companies.  You will be contacted with the lab results as soon as they are available. The fastest way to get your results is to activate your My Chart account. Instructions are located on the last page of this paperwork. If you have not heard from [place] regarding the results in 2 weeks, please contact this office.     Sangrado rectal (Rectal Bleeding) Se llama hemorragia rectal al sangrado que aparece por la abertura de las nalgas (ano). Las personas con este tipo de sangrado pueden observar sangre de color rojo brillante en su ropa interior o en el inodoro despus de defecar . Adems, tener heces (materia fecal) de color rojo oscuro o negro. El sangrado rectal indica que hay algn problema. El control del sangrado debe estar a cargo de un mdico. CUIDADOS EN EL [employer] la afeccin para Actuary cambio. Para aliviar el sangrado y las [place], tome las siguientes medidas:  Coma alimentos con alto contenido de [place]. Esto mantendr las heces blandas para que le resulte ms fcil defecar sin Database administrator. Pregntele el mdico qu alimentos y bebidas tienen alto contenido de [place].  Beba suficiente lquido para mantener el pis (orina) claro o de color amarillo plido. Esto tambin ayuda a [employer].  Intente tomar un bao con agua caliente. Esto puede Best boy.  Concurra a todas las visitas de control como se lo haya indicado el mdico. Esto es importante. SOLICITE AYUDA DE INMEDIATO SI:  Tiene un nuevo sangrado.  Tiene ms sangrado que antes.  La materia fecal es negra o de color rojo oscuro.  Vomita sangre o una sustancia parecida a los granos de [place].  Tiene dolor o molestias en el vientre (abdomen).  Tiene fiebre.  Se siente dbil.  Siente malestar estomacal (nuseas).  Pierde el conocimiento (se desmaya).  Siente un dolor muy intenso en el ano.  No puede defecar. Esta informacin no tiene Marine scientist el consejo del mdico. Asegrese de hacerle al mdico cualquier pregunta que tenga. Document Revised: 11/12/2016 Document Reviewed: 12/08/2015 Elsevier Patient Education  2020 [employer].

## 2020-07-16 NOTE — Progress Notes (Signed)
Brittney Pena 44 y.o.   Chief Complaint  Patient presents with  . Transitions Of Care    former Brittney Cobia NP patient  . Rectal Bleeding    x 2 months per patient after bowel movement and see blood on the tissue  . Abdominal Pain    per patient for 2 months    HISTORY OF PRESENT ILLNESS: This is a 44 y.o. female complaining of left lower abdominal pain for 2 months along with intermittent rectal bleeding. Able to eat and drink.  Denies nausea or vomiting.  Occasional constipation but still has daily bowel movement. Denies fever or chills.  No associated symptoms. No other complaints or medical concerns today. First visit with me.  Here to establish care.  HPI   Prior to Admission medications   Medication Sig Start Date End Date Taking? Authorizing Provider  GLUCOSAMINE-CHONDROITIN PO Take by mouth daily.   Yes [provider]  Nyoka Cowden Tea, Camellia sinensis, (GREEN TEA PO) Take by mouth daily.   Yes [provider]  levothyroxine (SYNTHROID) 175 MCG tablet Take 1 tablet (175 mcg total) by mouth daily before breakfast. 02/07/20  Yes Wendall Mola, NP    No Known Allergies  Patient Active Problem List   Diagnosis Date Noted  . Women's annual routine gynecological examination 02/07/2020  . Menorrhagia with regular cycle 02/07/2020  . Screen for STD (sexually transmitted disease) 02/07/2020  . Screening for metabolic disorder 50/12/7046  . Hypothyroidism 02/07/2020  . Encounter for screening mammogram for malignant neoplasm of breast 02/07/2020  . Ruptured ectopic pregnancy 02/15/2019  . Cluster headache 04/11/2015  . Galactorrhea 04/11/2015  . DUB (dysfunctional uterine bleeding) 04/11/2015  . Other specified hypothyroidism 04/11/2015    Past Medical History:  Diagnosis Date  . Hypothyroidism   . Thyroid disease     Past Surgical History:  Procedure Laterality Date  . BREAST BIOPSY Right    needle core biopsy, benign  . CESAREAN SECTION     x1    . DIAGNOSTIC LAPAROSCOPY WITH REMOVAL OF ECTOPIC PREGNANCY N/A 02/15/2019   Procedure: DIAGNOSTIC LAPAROSCOPY WITH REMOVAL OF RUPTURED ECTOPIC PREGNANCY AND EVACUATION OF HEMOPERITONEUM;  Surgeon: Sloan Leiter, MD;  Location: [place];  Service: Gynecology;  Laterality: N/A;  . LAPAROSCOPIC LYSIS OF ADHESIONS N/A 02/15/2019   Procedure: Laparoscopic Lysis Of Adhesions;  Surgeon: Sloan Leiter, MD;  Location: [place];  Service: Gynecology;  Laterality: N/A;  . LAPAROSCOPIC UNILATERAL SALPINGECTOMY Left 02/15/2019   Procedure: Laparoscopic Unilateral Salpingectomy;  Surgeon: Sloan Leiter, MD;  Location: [place];  Service: Gynecology;  Laterality: Left;    Social History   Socioeconomic History  . Marital status: Single    Spouse name: Not on file  . Number of children: 2  . Years of education: Not on file  . Highest education level: Not on file  Occupational History  . Not on file  Tobacco Use  . Smoking status: Former Smoker    Packs/day: 0.00  . Smokeless tobacco: Never Used  . Tobacco comment: refused to answer question   Vaping Use  . Vaping Use: Never used  Substance and Sexual Activity  . Alcohol use: Yes    Alcohol/week: 0.0 standard drinks    Comment: Social  . Drug use: No  . Sexual activity: Yes    Partners: Male    Birth control/protection: Condom    Comment: pregnant-ectopic preg  Other Topics Concern  . Not on file  Social History Narrative  Pt is from [place]. She has lived in [place] for 10 years. Her 2 children live with her.    Social Determinants of Health   Financial Resource Strain:   . Difficulty of Paying Living Expenses: Not on file  Food Insecurity:   . Worried About Charity fundraiser in the Last Year: Not on file  . Ran Out of Food in the Last Year: Not on file  Transportation Needs:   . Lack of Transportation (Medical): Not on file  . Lack of Transportation (Non-Medical): Not on file  Physical Activity:   . Days of Exercise per Week: Not on  file  . Minutes of Exercise per Session: Not on file  Stress:   . Feeling of Stress : Not on file  Social Connections:   . Frequency of Communication with Friends and Family: Not on file  . Frequency of Social Gatherings with Friends and Family: Not on file  . Attends Religious Services: Not on file  . Active Member of Clubs or Organizations: Not on file  . Attends Archivist Meetings: Not on file  . Marital Status: Not on file  Intimate Partner Violence:   . Fear of Current or Ex-Partner: Not on file  . Emotionally Abused: Not on file  . Physically Abused: Not on file  . Sexually Abused: Not on file    Family History  Problem Relation Age of Onset  . Prostate cancer Father   . Breast cancer Maternal Aunt 45     Review of Systems  Constitutional: Negative.  Negative for chills and fever.  HENT: Negative.  Negative for congestion and sore throat.   Respiratory: Negative.  Negative for cough and shortness of breath.   Cardiovascular: Negative.  Negative for chest pain and palpitations.  Gastrointestinal: Positive for abdominal pain and blood in stool. Negative for constipation, diarrhea, heartburn, melena, nausea and vomiting.  Genitourinary: Negative.  Negative for dysuria and hematuria.  Musculoskeletal: Negative.  Negative for myalgias and neck pain.  Skin: Negative.  Negative for rash.  Neurological: Negative.  Negative for dizziness and headaches.  All other systems reviewed and are negative.  Today's Vitals   07/16/20 1612  BP: 107/65  Pulse: 72  Resp: 16  Temp: 98.1 F (36.7 C)  TempSrc: Temporal  SpO2: 98%  Weight: 152 lb (68.9 kg)  Height: 5\' 6"  (1.676 m)   Body mass index is 24.53 kg/m.   Physical Exam Vitals reviewed.  Constitutional:      Appearance: She is well-developed.  HENT:     Head: Normocephalic.  Eyes:     Extraocular Movements: Extraocular movements intact.     Pupils: Pupils are equal, round, and reactive to light.    Cardiovascular:     Rate and Rhythm: Normal rate.  Pulmonary:     Effort: Pulmonary effort is normal.  Abdominal:     General: There is no distension.     Palpations: Abdomen is soft.     Tenderness: There is no abdominal tenderness.  Genitourinary:    Rectum: Guaiac result negative. External hemorrhoid present. No mass, tenderness or anal fissure. Normal anal tone.  Musculoskeletal:        General: Normal range of motion.     Cervical back: Normal range of motion.  Skin:    General: Skin is warm and dry.     Capillary Refill: Capillary refill takes less than 2 seconds.  Neurological:     General: No focal deficit present.  Mental Status: She is alert and oriented to person, place, and time.  Psychiatric:        Mood and Affect: Mood normal.        Behavior: Behavior normal.    A total of 30 minutes was spent with the patient, greater than 50% of which was in counseling/coordination of care regarding differential diagnosis of rectal bleeding and lower abdominal pain, need for diagnostic work-up including GI evaluation and possible colonoscopy, diet and nutrition, review of most recent office visit notes, review of most recent blood work results, ED precautions, prognosis and need for follow-up.   ASSESSMENT & PLAN: Brandan was seen today for transitions of care, rectal bleeding and abdominal pain.  Diagnoses and all orders for this visit:  Left lower quadrant abdominal pain -     Ambulatory referral to Gastroenterology  Rectal bleeding -     Ambulatory referral to Gastroenterology -     POC Hemoccult Bld/Stl (1-Cd Office Dx)    Patient Instructions       If you have lab work done today you will be contacted with your lab results within the next 2 weeks.  If you have not heard from [place] then please contact us. The fastest way to get your results is to register for My Chart.   IF you received an x-ray today, you will receive an invoice from [facility]. Please  contact [facility] at [phone] with questions or concerns regarding your invoice.   IF you received labwork today, you will receive an invoice from [place]. Please contact [facility] at [phone] with questions or concerns regarding your invoice.   Our billing staff will not be able to assist you with questions regarding bills from these companies.  You will be contacted with the lab results as soon as they are available. The fastest way to get your results is to activate your My Chart account. Instructions are located on the last page of this paperwork. If you have not heard from [place] regarding the results in 2 weeks, please contact this office.     Sangrado rectal (Rectal Bleeding) Se llama hemorragia rectal al sangrado que aparece por la abertura de las nalgas (ano). Las personas con este tipo de sangrado pueden observar sangre de color rojo brillante en su ropa interior o en el inodoro despus de defecar . Adems, tener heces (materia fecal) de color rojo oscuro o negro. El sangrado rectal indica que hay algn problema. El control del sangrado debe estar a cargo de un mdico. CUIDADOS EN EL [employer] la afeccin para Actuary cambio. Para aliviar el sangrado y las [place], tome las siguientes medidas:  Coma alimentos con alto contenido de [place]. Esto mantendr las heces blandas para que le resulte ms fcil defecar sin Database administrator. Pregntele el mdico qu alimentos y bebidas tienen alto contenido de [place].  Beba suficiente lquido para mantener el pis (orina) claro o de color amarillo plido. Esto tambin ayuda a [employer].  Intente tomar un bao con agua caliente. Esto puede Best boy.  Concurra a todas las visitas de control como se lo haya indicado el mdico. Esto es importante. SOLICITE AYUDA DE INMEDIATO SI:  Tiene un nuevo sangrado.  Tiene ms sangrado que antes.  La materia fecal es negra o de color rojo  oscuro.  Vomita sangre o una sustancia parecida a los granos de [place].  Tiene dolor o molestias en el vientre (abdomen).  Tiene fiebre.  Se siente  dbil.  Siente malestar estomacal (nuseas).  Pierde el conocimiento (se desmaya).  Siente un dolor muy intenso en el ano.  No puede defecar. Esta informacin no tiene Marine scientist el consejo del mdico. Asegrese de hacerle al mdico cualquier pregunta que tenga. Document Revised: 11/12/2016 Document Reviewed: 12/08/2015 Elsevier Patient Education  2020 Elsevier Inc.      Agustina Caroli, MD [facility]

## 2020-08-20 ENCOUNTER — Encounter: Payer: Self-pay | Admitting: Gastroenterology

## 2020-09-04 ENCOUNTER — Encounter: Payer: Self-pay | Admitting: Gastroenterology

## 2020-09-04 ENCOUNTER — Ambulatory Visit (INDEPENDENT_AMBULATORY_CARE_PROVIDER_SITE_OTHER): Payer: 59 | Admitting: Gastroenterology

## 2020-09-04 VITALS — BP 100/72 | HR 75 | Ht 66.0 in | Wt 153.1 lb

## 2020-09-04 DIAGNOSIS — K625 Hemorrhage of anus and rectum: Secondary | ICD-10-CM | POA: Diagnosis not present

## 2020-09-04 DIAGNOSIS — R1032 Left lower quadrant pain: Secondary | ICD-10-CM

## 2020-09-04 MED ORDER — SUPREP BOWEL PREP KIT 17.5-3.13-1.6 GM/177ML PO SOLN: 1.0000 | ORAL | 0 refills | Status: DC

## 2020-09-04 NOTE — Progress Notes (Signed)
09/04/2020 Brittney Pena [id number] 1976-05-21   HISTORY OF PRESENT ILLNESS: This is a 44 year old female who is new to our office.  She has been referred here by Dr. Mitchel Honour for evaluation regarding left lower quadrant abdominal pain and rectal bleeding.  She tells me that she has been seeing bright red blood per rectum on and off for about the past 5 months.  She reports that she has had intermittent left lower quadrant abdominal pain for years.  At some point she says she was told it could be diverticulitis, but I do not see any type of imaging, etc.  She says that sometimes it gets to about a 5 or 6 out of 10 on the pain scale.  Other times she does not have any pain.  She tells me that her bowel movements are normal with neither extreme constipation or diarrhea.  Patient is primarily Spanish-speaking so the entire visit was performed via interpreter/translator.   Past Medical History:  Diagnosis Date  . Hypothyroidism   . Thyroid disease    Past Surgical History:  Procedure Laterality Date  . BREAST BIOPSY Right    needle core biopsy, benign  . CESAREAN SECTION     x1  . DIAGNOSTIC LAPAROSCOPY WITH REMOVAL OF ECTOPIC PREGNANCY N/A 02/15/2019   Procedure: DIAGNOSTIC LAPAROSCOPY WITH REMOVAL OF RUPTURED ECTOPIC PREGNANCY AND EVACUATION OF HEMOPERITONEUM;  Surgeon: Sloan Leiter, MD;  Location: [place];  Service: Gynecology;  Laterality: N/A;  . LAPAROSCOPIC LYSIS OF ADHESIONS N/A 02/15/2019   Procedure: Laparoscopic Lysis Of Adhesions;  Surgeon: Sloan Leiter, MD;  Location: [place];  Service: Gynecology;  Laterality: N/A;  . LAPAROSCOPIC UNILATERAL SALPINGECTOMY Left 02/15/2019   Procedure: Laparoscopic Unilateral Salpingectomy;  Surgeon: Sloan Leiter, MD;  Location: [place];  Service: Gynecology;  Laterality: Left;    reports that she has quit smoking. She smoked 0.00 packs per day. She has never used smokeless tobacco. She reports current alcohol use. She reports that she does not  use drugs. family history includes Breast cancer (age of onset: 40) in her maternal aunt; Colon cancer in her father. No Known Allergies    Outpatient Encounter Medications as of 09/04/2020  Medication Sig  . GLUCOSAMINE-CHONDROITIN PO Take by mouth daily.  Nyoka Cowden Tea, Camellia sinensis, (GREEN TEA PO) Take by mouth daily.  Marland Kitchen levothyroxine (SYNTHROID) 175 MCG tablet Take 1 tablet (175 mcg total) by mouth daily before breakfast.   No facility-administered encounter medications on file as of 09/04/2020.    REVIEW OF SYSTEMS  : All other systems reviewed and negative except where noted in the History of Present Illness.  PHYSICAL EXAM: BP 100/72   Pulse 75   Ht 5\' 6"  (1.676 m)   Wt 153 lb 2 oz (69.5 kg)   BMI 24.72 kg/m  General: Well developed white female in no acute distress Head: Normocephalic and atraumatic Eyes:  Sclerae anicteric, conjunctiva pink. Ears: Normal auditory acuity Lungs: Clear throughout to auscultation; no W/R/R. Heart: Regular rate and rhythm; no M/R/G. Abdomen: Soft, non-distended.  BS present.  Minimal left sided TTP. Rectal:  Will be done at the time of colonoscopy. Musculoskeletal: Symmetrical with no gross deformities  Skin: No lesions on visible extremities Extremities: No edema  Neurological: Alert oriented x 4, grossly non-focal Psychological:  Alert and cooperative. Normal mood and affect  ASSESSMENT AND PLAN: *Rectal bleeding:  Intermittently for 5 months or so. *Left sided abdominal pain, on and off for years:  Says  that she was told previously that it could be diverticulitis, but I do not see any CT imaging, etc.  **Will plan for colonoscopy with Dr. Henrene Pastor.  The risks, benefits, and alternatives to colonoscopy were discussed with the patient and she consents to proceed.    CC:  Horald Pollen, [place]

## 2020-09-04 NOTE — Patient Instructions (Addendum)
COLONOSCOPIA: Se le ha programado una colonoscopia. Siga las instrucciones escritas que se le dieron en su visita de hoy.  PREPARACIN: Recoja sus suministros de preparacin en la farmacia dentro de los prximos 1-3 das.  INHALADORES: Si [place] inhaladores (aunque solo sea necesario), Administrator, Civil Service del procedimiento.  Si tiene 70 aos o menos, su ndice de [employer] corporal debe estar entre 19-25. Su ndice de masa corporal es de 24,72 kg / m. Si esto est fuera del rango mencionado anteriormente, considere hacer un seguimiento con su Proveedor de Midwife.  Gracias por confiarme su cuidado gastrointestinal!  Alonza Bogus, PA-C

## 2020-09-05 ENCOUNTER — Encounter: Payer: Self-pay | Admitting: Gastroenterology

## 2020-09-05 DIAGNOSIS — K625 Hemorrhage of anus and rectum: Secondary | ICD-10-CM | POA: Insufficient documentation

## 2020-09-05 DIAGNOSIS — R1032 Left lower quadrant pain: Secondary | ICD-10-CM | POA: Insufficient documentation

## 2020-09-05 NOTE — Progress Notes (Signed)
Assessment and plans noted ?

## 2020-09-06 ENCOUNTER — Telehealth: Payer: Self-pay | Admitting: Gastroenterology

## 2020-09-06 NOTE — Telephone Encounter (Signed)
Patient is calling to find out when she needs to go for the covid test prior to her upcoming procedure 09/16/2020 for it is not documented in her prep instructions.

## 2020-09-06 NOTE — Telephone Encounter (Signed)
At the time of her visit, pt was asked at my request by the interpreter if she had been COVID vaccinated. Pt responded to the interpreter that she had in fact been vaccinated which was relayed to me as such. Therefore, given this conversation, pt would not need nor would have been scheduled for COVID testing. Interpreter at the time of this visit was Rockne Menghini. Routing this message to him so this message can be translated. Will need further clarification about her COVID vaccination history from pt to determine if her response was incorrect at the time of her visit AND if she does in fact require a COVID test appt.

## 2020-09-09 ENCOUNTER — Telehealth: Payer: Self-pay | Admitting: Internal Medicine

## 2020-09-09 NOTE — Telephone Encounter (Signed)
UPDATED prep instructions INCLUDING Covid Testing date/time/location has been mailed to pt. Refer to Letters under Chart Review within Epic. In addition refer to correspondence below re: need for this UPDATE:  Brittney Pena - Pt called back stating she did not understand the question regarding vaccinations for Covid-19, pt states she has not been vaccinated and needs to be tested for Covid-19.  Brittney Campanile, LPN - Appears pt has chosen to reschedule her colonoscopy as well. Appears she is now scheduled on 09/24/20. Generally pt should be scheduled 2 business days before her procedure. However, [facility] will be closed 09/20/20 for [facility] Friday and closed 09/19/20 for Thanksgiving. [facility] also has limited hours on 09/18/20 (open until 10am). In light of all this information, pt has now been scheduled for COVID testing 09/18/20 @ 9am. Routing this message to Brittney Pena for interpretation of this message. Pt will also be mailed this information for her future reference.

## 2020-09-09 NOTE — Telephone Encounter (Signed)
A response was sent to Rockne Menghini on Friday with a request to interpret my response to the patient. I have forwarded you the message as well. If this is not something that can be done, I will need to use a phone interpreting service. Please advise

## 2020-09-09 NOTE — Telephone Encounter (Signed)
Following message received today from Brittney Pena:  Patient called requesting information about her covid test appointment. States someone was suppose to contact her please advise  However, this was routed to Rockne Menghini who assisted with interpretation during pt OV as indicated below. Routing this message back to both Rockne Menghini and Brittney Pena to ensure this message is interpreted to the pt.

## 2020-09-09 NOTE — Telephone Encounter (Signed)
Informed pt of covid testing appt and directions, pt voiced understanding

## 2020-09-09 NOTE — Telephone Encounter (Signed)
Appears pt has chosen to reschedule her colonoscopy as well. Appears she is now scheduled on 09/24/20. Generally pt should be scheduled 2 business days before her procedure. However, [facility] will be closed 09/20/20 for [facility] Friday and closed 09/19/20 for Thanksgiving. [facility] also has limited hours on 09/18/20 (open until 10am). In light of all this information, pt has now been scheduled for COVID testing 09/18/20 @ 9am. Routing this message to Rockne Menghini for interpretation of this message. Pt will also be mailed this information for her future reference.

## 2020-09-09 NOTE — Telephone Encounter (Signed)
Pt called back stating she did not understand the question regarding vaccinations for Covid-19, pt states she has not been vaccinated and needs to be tested for Covid-19.

## 2020-09-09 NOTE — Telephone Encounter (Signed)
Patient called requesting information about her covid test appointment. States someone was suppose to contact her please advise

## 2020-09-16 ENCOUNTER — Encounter: Payer: 59 | Admitting: Internal Medicine

## 2020-09-18 ENCOUNTER — Other Ambulatory Visit: Payer: Self-pay | Admitting: Internal Medicine

## 2020-09-18 LAB — SARS CORONAVIRUS 2 (TAT 6-24 HRS): SARS Coronavirus 2: NEGATIVE

## 2020-09-24 ENCOUNTER — Encounter: Payer: Self-pay | Admitting: Internal Medicine

## 2020-09-24 ENCOUNTER — Other Ambulatory Visit: Payer: Self-pay

## 2020-09-24 ENCOUNTER — Ambulatory Visit (AMBULATORY_SURGERY_CENTER): Payer: 59 | Admitting: Internal Medicine

## 2020-09-24 VITALS — BP 98/68 | HR 61 | Temp 98.9°F | Resp 10 | Ht 66.0 in | Wt 153.0 lb

## 2020-09-24 DIAGNOSIS — R1032 Left lower quadrant pain: Secondary | ICD-10-CM

## 2020-09-24 DIAGNOSIS — K625 Hemorrhage of anus and rectum: Secondary | ICD-10-CM | POA: Diagnosis present

## 2020-09-24 DIAGNOSIS — K648 Other hemorrhoids: Secondary | ICD-10-CM

## 2020-09-24 MED ORDER — SODIUM CHLORIDE 0.9 % IV SOLN
500.0000 mL | Freq: Once | INTRAVENOUS | Status: DC
Start: 2020-09-24 — End: 2020-09-24

## 2020-09-24 NOTE — Progress Notes (Signed)
Report to PACU, RN, vss, BBS= Clear.  

## 2020-09-24 NOTE — Patient Instructions (Signed)
YOU HAD AN ENDOSCOPIC PROCEDURE TODAY AT THE [facility]:   Refer to the procedure report that was given to you for any specific questions about what was found during the examination.  If the procedure report does not answer your questions, please call your gastroenterologist to clarify.  If you requested that your care partner not be given the details of your procedure findings, then the procedure report has been included in a sealed envelope for you to review at your convenience later.  YOU SHOULD EXPECT: Some feelings of bloating in the abdomen. Passage of more gas than usual.  Walking can help get rid of the air that was put into your GI tract during the procedure and reduce the bloating. If you had a lower endoscopy (such as a colonoscopy or flexible sigmoidoscopy) you may notice spotting of blood in your stool or on the toilet paper. If you underwent a bowel prep for your procedure, you may not have a normal bowel movement for a few days.  Please Note:  You might notice some irritation and congestion in your nose or some drainage.  This is from the oxygen used during your procedure.  There is no need for concern and it should clear up in a day or so.  SYMPTOMS TO REPORT IMMEDIATELY:   Following lower endoscopy (colonoscopy or flexible sigmoidoscopy):  Excessive amounts of blood in the stool  Significant tenderness or worsening of abdominal pains  Swelling of the abdomen that is new, acute  Fever of 100F or higher  For urgent or emergent issues, a gastroenterologist can be reached at any hour by calling [phone]. Do not use MyChart messaging for urgent concerns.    DIET:  We do recommend a small meal at first, but then you may proceed to your regular diet.  Drink plenty of fluids but you should avoid alcoholic beverages for 24 hours.  ACTIVITY:  You should plan to take it easy for the rest of today and you should NOT DRIVE or use heavy machinery until tomorrow (because  of the sedation medicines used during the test).    FOLLOW UP: Our staff will call the number listed on your records 48-72 hours following your procedure to check on you and address any questions or concerns that you may have regarding the information given to you following your procedure. If we do not reach you, we will leave a message.  We will attempt to reach you two times.  During this call, we will ask if you have developed any symptoms of COVID 19. If you develop any symptoms (ie: fever, flu-like symptoms, shortness of breath, cough etc.) before then, please call [phone].  If you test positive for Covid 19 in the 2 weeks post procedure, please call and report this information to us.    If any biopsies were taken you will be contacted by phone or by letter within the next 1-3 weeks.  Please call us at [phone] if you have not heard about the biopsies in 3 weeks.    SIGNATURES/CONFIDENTIALITY: You and/or your care partner have signed paperwork which will be entered into your electronic medical record.  These signatures attest to the fact that that the information above on your After Visit Summary has been reviewed and is understood.  Full responsibility of the confidentiality of this discharge information lies with you and/or your care-partner. 

## 2020-09-24 NOTE — Op Note (Signed)
[facility] Patient Name: Brittney Pena Procedure Date: 09/24/2020 3:50 PM MRN: [id number] Endoscopist: Docia Chuck. Henrene Pastor , MD Age: 44 Referring MD:  Date of Birth: 06-06-1976 Gender: Female Account #: [id number] Procedure:                Colonoscopy Indications:              Abdominal pain in the left lower quadrant, Rectal                            bleeding, as recently as 3 days ago. The patient                            reports intermittent rectal bleeding and fairly                            constant left lower quadrant pain over the past 5                            to 6 months. No weight loss. Normal hemoglobin of                            13.4. Outpatient Hemoccult testing of stool was                            negative for blood. Has had GYN evaluation for the                            pain. Tells me that her father had colon cancer                            around age 13. Medicines:                Monitored Anesthesia Care Procedure:                Pre-Anesthesia Assessment:                           - Prior to the procedure, a History and Physical                            was performed, and patient medications and                            allergies were reviewed. The patient's tolerance of                            previous anesthesia was also reviewed. The risks                            and benefits of the procedure and the sedation                            options and risks were discussed with the patient.  All questions were answered, and informed consent                            was obtained. Prior Anticoagulants: The patient has                            taken no previous anticoagulant or antiplatelet                            agents. ASA Grade Assessment: II - A patient with                            mild systemic disease. After reviewing the risks                            and benefits, the patient was deemed in                             satisfactory condition to undergo the procedure.                           After obtaining informed consent, the colonoscope                            was passed under direct vision. Throughout the                            procedure, the patient's blood pressure, pulse, and                            oxygen saturations were monitored continuously. The                            Colonoscope was introduced through the anus and                            advanced to the the cecum, identified by                            appendiceal orifice and ileocecal valve. The                            ileocecal valve, appendiceal orifice, and rectum                            were photographed. The quality of the bowel                            preparation was good. The colonoscopy was performed                            without difficulty. The patient tolerated the  procedure well. The bowel preparation used was                            SUPREP via split dose instruction. Scope In: 4:06:25 PM Scope Out: 4:20:29 PM Scope Withdrawal Time: 0 hours 9 minutes 35 seconds  Total Procedure Duration: 0 hours 14 minutes 4 seconds  Findings:                 The entire examined colon appeared normal on direct                            and retroflexion views. Small internal hemorrhoids                            present. Complications:            No immediate complications. Estimated blood loss:                            None. Estimated Blood Loss:     Estimated blood loss: none. Impression:               - The entire examined colon is normal on direct and                            retroflexion views.                           - Small internal hemorrhoids. Recommendation:           - Repeat colonoscopy in 5 years for screening                            purposes (based on reported family history of colon                            cancer in your  father less than age 46).                           - Patient has a contact number available for                            emergencies. The signs and symptoms of potential                            delayed complications were discussed with the                            patient. Return to normal activities tomorrow.                            Written discharge instructions were provided to the                            patient.                           -  Resume previous diet.                           - Continue present medications.                           - Increase dietary fiber                           - Schedule contrast-enhanced CT scan of the abdomen                            and pelvis "chronic left lower quadrant pain,                            evaluate". We will contact you with the results                            when available                           -Return to the care of your primary provider Docia Chuck. Henrene Pastor, MD 09/24/2020 4:27:51 PM This report has been signed electronically.

## 2020-09-24 NOTE — Progress Notes (Signed)
VS taken by C.W. 

## 2020-09-25 ENCOUNTER — Telehealth: Payer: Self-pay

## 2020-09-25 ENCOUNTER — Other Ambulatory Visit: Payer: Self-pay

## 2020-09-25 DIAGNOSIS — R1032 Left lower quadrant pain: Secondary | ICD-10-CM

## 2020-09-25 NOTE — Telephone Encounter (Signed)
Pt scheduled for CT of Abdomen and pelvis at [facility] 10/08/20 at 8:30am, pt will need to arrive in the xray department at 8:15am. She will need to be NPO after midnight. She needs to go by the xray department prior to the scan to pick up the contrast to drink, they will give her the instructions. She should go at least 24 hours prior to the appt to pick up the contrast.

## 2020-09-26 ENCOUNTER — Telehealth: Payer: Self-pay | Admitting: *Deleted

## 2020-09-26 ENCOUNTER — Telehealth: Payer: Self-pay

## 2020-09-26 NOTE — Telephone Encounter (Signed)
Left message to call back  

## 2020-09-26 NOTE — Telephone Encounter (Signed)
First attempt follow up call to pt, lm on vm 

## 2020-09-26 NOTE — Telephone Encounter (Signed)
No answer for post procedure call back. Left message for patient to call with questions or concerns. 

## 2020-09-27 ENCOUNTER — Telehealth: Payer: Self-pay | Admitting: Internal Medicine

## 2020-09-27 NOTE — Telephone Encounter (Signed)
Pt is informing the nurse she is doing fine.

## 2020-09-27 NOTE — Telephone Encounter (Signed)
Pt did not need to stop her oral contraceptive pills before taking the prep. The vaginal bleeding is not related to the colonoscopy. She should contact her PCP or GYN regarding the bleeding.

## 2020-09-27 NOTE — Telephone Encounter (Signed)
Telephone only rang once and was routed to an answering machine. Left a voicemail in spanish to call back.

## 2020-09-27 NOTE — Telephone Encounter (Signed)
Tells me she took some contraception pills for two days and stopped to prep for colonoscopy, believes it could be the cause for the vaginal bleed but still wanted me to send a message    Just to be completely sure it's not related to the procedure on 09-24-2020.

## 2020-10-01 NOTE — Telephone Encounter (Signed)
Were you able to reach this pt regarding her CT appt?

## 2020-10-02 NOTE — Telephone Encounter (Signed)
I just spoke with pt and gave her all the information about CT scan.

## 2020-10-08 ENCOUNTER — Ambulatory Visit (HOSPITAL_COMMUNITY): Payer: 59

## 2020-12-02 ENCOUNTER — Other Ambulatory Visit: Payer: Self-pay | Admitting: Emergency Medicine

## 2020-12-02 DIAGNOSIS — E039 Hypothyroidism, unspecified: Secondary | ICD-10-CM

## 2020-12-02 MED ORDER — LEVOTHYROXINE SODIUM 175 MCG PO TABS: 175.0000 ug | ORAL_TABLET | Freq: Every day | ORAL | 1 refills | Status: DC

## 2020-12-02 NOTE — Telephone Encounter (Signed)
Medication: levothyroxine (SYNTHROID) 175 MCG tablet [[id number]]   Has the patient contacted their pharmacy? YES  (Agent: If no, request that the patient contact the pharmacy for the refill.) (Agent: If yes, when and what did the pharmacy advise?)  Preferred Pharmacy (with phone number or street name): [facility], [address]. [place] [address] Phone: [phone] Fax: [phone] Hours: Not open 24 hours    Agent: Please be advised that RX refills may take up to 3 business days. We ask that you follow-up with your pharmacy.

## 2020-12-02 NOTE — Telephone Encounter (Signed)
Requested medication (s) are due for refill today: Yes  Requested medication (s) are on the active medication list: Yes  Last refill:  02/07/20  Future visit scheduled: Yes  Notes to clinic:  Unable to refill per protocol, last refill by another provider.      Requested Prescriptions  Pending Prescriptions Disp Refills   levothyroxine (SYNTHROID) 175 MCG tablet 90 tablet 1    Sig: Take 1 tablet (175 mcg total) by mouth daily before breakfast.      Endocrinology:  Hypothyroid Agents Failed - 12/02/2020 12:08 PM      Failed - TSH needs to be rechecked within 3 months after an abnormal result. Refill until TSH is due.      Failed - TSH in normal range and within 360 days    TSH  Date Value Ref Range Status  02/07/2020 13.700 (H) 0.450 - 4.500 uIU/mL Final          Passed - Valid encounter within last 12 months    Recent Outpatient Visits           4 months ago Left lower quadrant abdominal pain   Primary Care at [facility], Ines Bloomer, MD   9 months ago Women's annual routine gynecological examination   Primary Care at [facility], Lorelee Market, NP   2 years ago Annual physical exam   Primary Care at [place], [place] D, PA-C   5 years ago Hypothyroidism due to acquired atrophy of thyroid   Primary Care at [facility], Fenton Malling, MD   5 years ago Neck pain   Primary Care at [facility], MD       Future Appointments             In 3 months Sagardia, Ines Bloomer, MD Primary Care at [place], [facility]

## 2021-01-07 ENCOUNTER — Telehealth: Payer: Self-pay | Admitting: Internal Medicine

## 2021-01-07 NOTE — Telephone Encounter (Signed)
Please call pt and let her know she can call [phone] to reschedule her scan.

## 2021-01-07 NOTE — Telephone Encounter (Signed)
Pt is requesting a call back to reschedule her CT scan at [facility].  Pt is spanish speaking

## 2021-01-09 NOTE — Telephone Encounter (Signed)
Pt was given the phone number to [facility].

## 2021-01-16 ENCOUNTER — Other Ambulatory Visit: Payer: Self-pay

## 2021-01-16 ENCOUNTER — Ambulatory Visit (HOSPITAL_COMMUNITY)
Admission: RE | Admit: 2021-01-16 | Discharge: 2021-01-16 | Disposition: A | Discharge status: Home / Self Care | Payer: 59 | Source: Ambulatory Visit | Attending: Obstetrics and Gynecology | Admitting: Obstetrics and Gynecology

## 2021-01-16 DIAGNOSIS — N83201 Unspecified ovarian cyst, right side: Secondary | ICD-10-CM | POA: Diagnosis not present

## 2021-01-20 ENCOUNTER — Ambulatory Visit (HOSPITAL_COMMUNITY): Payer: 59

## 2021-01-22 ENCOUNTER — Ambulatory Visit (HOSPITAL_COMMUNITY)
Admission: RE | Admit: 2021-01-22 | Discharge: 2021-01-22 | Disposition: A | Discharge status: Home / Self Care | Payer: 59 | Source: Ambulatory Visit | Attending: Internal Medicine | Admitting: Internal Medicine

## 2021-01-22 ENCOUNTER — Other Ambulatory Visit: Payer: Self-pay

## 2021-01-22 DIAGNOSIS — R1032 Left lower quadrant pain: Secondary | ICD-10-CM | POA: Diagnosis present

## 2021-01-22 MED ORDER — IOHEXOL 300 MG/ML  SOLN
100.0000 mL | Freq: Once | INTRAMUSCULAR | Status: AC | PRN
  Administered 2021-01-22: 100 mL via INTRAVENOUS

## 2021-03-25 ENCOUNTER — Encounter: Payer: Self-pay | Admitting: Emergency Medicine

## 2021-03-25 ENCOUNTER — Ambulatory Visit (INDEPENDENT_AMBULATORY_CARE_PROVIDER_SITE_OTHER): Payer: 59 | Admitting: Emergency Medicine

## 2021-03-25 ENCOUNTER — Other Ambulatory Visit: Payer: Self-pay

## 2021-03-25 VITALS — BP 98/60 | HR 66 | Temp 98.7°F | Ht 66.0 in | Wt 147.0 lb

## 2021-03-25 DIAGNOSIS — Z0000 Encounter for general adult medical examination without abnormal findings: Secondary | ICD-10-CM | POA: Diagnosis not present

## 2021-03-25 DIAGNOSIS — Z13220 Encounter for screening for lipoid disorders: Secondary | ICD-10-CM | POA: Diagnosis not present

## 2021-03-25 DIAGNOSIS — Z130 Encounter for screening for diseases of the blood and blood-forming organs and certain disorders involving the immune mechanism: Secondary | ICD-10-CM | POA: Diagnosis not present

## 2021-03-25 DIAGNOSIS — E039 Hypothyroidism, unspecified: Secondary | ICD-10-CM

## 2021-03-25 DIAGNOSIS — Z13228 Encounter for screening for other metabolic disorders: Secondary | ICD-10-CM

## 2021-03-25 DIAGNOSIS — Z1329 Encounter for screening for other suspected endocrine disorder: Secondary | ICD-10-CM

## 2021-03-25 LAB — CBC WITH DIFFERENTIAL/PLATELET
Basophils Absolute: 0.1 10*3/uL (ref 0.0–0.1)
Basophils Relative: 1 % (ref 0.0–3.0)
Eosinophils Absolute: 0.2 10*3/uL (ref 0.0–0.7)
Eosinophils Relative: 2.6 % (ref 0.0–5.0)
HCT: 38.7 % (ref 36.0–46.0)
Hemoglobin: 13.2 g/dL (ref 12.0–15.0)
Lymphocytes Relative: 31.6 % (ref 12.0–46.0)
Lymphs Abs: 2.4 10*3/uL (ref 0.7–4.0)
MCHC: 34.1 g/dL (ref 30.0–36.0)
MCV: 89.5 fl (ref 78.0–100.0)
Monocytes Absolute: 0.6 10*3/uL (ref 0.1–1.0)
Monocytes Relative: 7.4 % (ref 3.0–12.0)
Neutro Abs: 4.3 10*3/uL (ref 1.4–7.7)
Neutrophils Relative %: 57.4 % (ref 43.0–77.0)
Platelets: 240 10*3/uL (ref 150.0–400.0)
RBC: 4.33 Mil/uL (ref 3.87–5.11)
RDW: 13.4 % (ref 11.5–15.5)
WBC: 7.5 10*3/uL (ref 4.0–10.5)

## 2021-03-25 LAB — COMPREHENSIVE METABOLIC PANEL
ALT: 13 U/L (ref 0–35)
AST: 14 U/L (ref 0–37)
Albumin: 4.4 g/dL (ref 3.5–5.2)
Alkaline Phosphatase: 45 U/L (ref 39–117)
BUN: 15 mg/dL (ref 6–23)
CO2: 27 mEq/L (ref 19–32)
Calcium: 9.4 mg/dL (ref 8.4–10.5)
Chloride: 106 mEq/L (ref 96–112)
Creatinine, Ser: 0.61 mg/dL (ref 0.40–1.20)
GFR: 108.45 mL/min (ref >60.00)
Glucose, Bld: 90 mg/dL (ref 70–99)
Potassium: 3.7 mEq/L (ref 3.5–5.1)
Sodium: 139 mEq/L (ref 135–145)
Total Bilirubin: 0.5 mg/dL (ref 0.2–1.2)
Total Protein: 7 g/dL (ref 6.0–8.3)

## 2021-03-25 LAB — HEMOGLOBIN A1C: Hgb A1c MFr Bld: 5.7 % (ref 4.6–6.5)

## 2021-03-25 LAB — TSH: TSH: 0.43 u[IU]/mL (ref 0.35–4.50)

## 2021-03-25 MED ORDER — LEVOTHYROXINE SODIUM 175 MCG PO TABS: 175.0000 ug | ORAL_TABLET | Freq: Every day | ORAL | 3 refills | Status: DC

## 2021-03-25 NOTE — Progress Notes (Signed)
Brittney Pena 45 y.o.   Chief Complaint  Patient presents with  . Annual Exam  . Medication Refill    Levothyroxine    HISTORY OF PRESENT ILLNESS: This is a 45 y.o. female here for annual exam. Has history of hypothyroidism on levothyroxine 175 mcg daily. No other complaints or medical concerns today. Colonoscopy done on 09/24/2020 by Dr. Henrene Pastor reviewed with patient.  Small internal hemorrhoids otherwise normal exam. Lab Results  Component Value Date   TSH 13.700 (H) 02/07/2020    HPI   Prior to Admission medications   Medication Sig Start Date End Date Taking? Authorizing Provider  GLUCOSAMINE-CHONDROITIN PO Take by mouth daily.   Yes [provider]  Nyoka Cowden Tea, Camellia sinensis, (GREEN TEA PO) Take by mouth daily.   Yes [provider]  levothyroxine (SYNTHROID) 175 MCG tablet Take 1 tablet (175 mcg total) by mouth daily before breakfast. 12/02/20  Yes Kyrese Gartman, Ines Bloomer, MD    No Known Allergies  Patient Active Problem List   Diagnosis Date Noted  . Rectal bleeding 09/05/2020  . LLQ abdominal pain 09/05/2020  . Women's annual routine gynecological examination 02/07/2020  . Menorrhagia with regular cycle 02/07/2020  . Screen for STD (sexually transmitted disease) 02/07/2020  . Screening for metabolic disorder 82/99/3716  . Hypothyroidism 02/07/2020  . Encounter for screening mammogram for malignant neoplasm of breast 02/07/2020  . Ruptured ectopic pregnancy 02/15/2019  . Cluster headache 04/11/2015  . Galactorrhea 04/11/2015  . DUB (dysfunctional uterine bleeding) 04/11/2015  . Other specified hypothyroidism 04/11/2015    Past Medical History:  Diagnosis Date  . Hypothyroidism   . Thyroid disease     Past Surgical History:  Procedure Laterality Date  . BREAST BIOPSY Right    needle core biopsy, benign  . CESAREAN SECTION     x1  . DIAGNOSTIC LAPAROSCOPY WITH REMOVAL OF ECTOPIC PREGNANCY N/A 02/15/2019   Procedure: DIAGNOSTIC  LAPAROSCOPY WITH REMOVAL OF RUPTURED ECTOPIC PREGNANCY AND EVACUATION OF HEMOPERITONEUM;  Surgeon: Sloan Leiter, MD;  Location: [facility];  Service: Gynecology;  Laterality: N/A;  . LAPAROSCOPIC LYSIS OF ADHESIONS N/A 02/15/2019   Procedure: Laparoscopic Lysis Of Adhesions;  Surgeon: Sloan Leiter, MD;  Location: [facility];  Service: Gynecology;  Laterality: N/A;  . LAPAROSCOPIC UNILATERAL SALPINGECTOMY Left 02/15/2019   Procedure: Laparoscopic Unilateral Salpingectomy;  Surgeon: Sloan Leiter, MD;  Location: [facility];  Service: Gynecology;  Laterality: Left;    Social History   Socioeconomic History  . Marital status: Single    Spouse name: Not on file  . Number of children: 2  . Years of education: Not on file  . Highest education level: Not on file  Occupational History  . Not on file  Tobacco Use  . Smoking status: Former Smoker    Packs/day: 0.00  . Smokeless tobacco: Never Used  . Tobacco comment: refused to answer question   Vaping Use  . Vaping Use: Never used  Substance and Sexual Activity  . Alcohol use: Yes    Alcohol/week: 0.0 standard drinks    Comment: Social  . Drug use: No  . Sexual activity: Yes    Partners: Male    Birth control/protection: Condom    Comment: pregnant-ectopic preg  Other Topics Concern  . Not on file  Social History Narrative   Pt is from [place]. She has lived in [place] for 10 years. Her 2 children live with her.    Social Determinants of Health   Financial Resource Strain:  Not on file  Food Insecurity: Not on file  Transportation Needs: Not on file  Physical Activity: Not on file  Stress: Not on file  Social Connections: Not on file  Intimate Partner Violence: Not on file    Family History  Problem Relation Age of Onset  . Colon cancer Father   . Breast cancer Maternal Aunt 13  . Stomach cancer Neg Hx   . Rectal cancer Neg Hx   . Esophageal cancer Neg Hx      Review of Systems  Constitutional: Negative.  Negative for chills  and fever.  HENT: Negative.  Negative for congestion and sore throat.   Respiratory: Negative.  Negative for cough and shortness of breath.   Cardiovascular: Negative.  Negative for chest pain and palpitations.  Gastrointestinal: Negative for abdominal pain, diarrhea, nausea and vomiting.  Genitourinary: Negative.  Negative for dysuria and hematuria.  Skin: Negative.   Neurological: Negative.  Negative for dizziness and headaches.  All other systems reviewed and are negative.  Vitals:   03/25/21 1521  BP: 98/60  Pulse: 66  Temp: 98.7 F (37.1 C)  SpO2: 97%       Physical Exam Vitals reviewed.  Constitutional:      Appearance: Normal appearance.  HENT:     Head: Normocephalic.  Eyes:     Extraocular Movements: Extraocular movements intact.     Pupils: Pupils are equal, round, and reactive to light.  Cardiovascular:     Rate and Rhythm: Normal rate.  Pulmonary:     Effort: Pulmonary effort is normal.  Musculoskeletal:        General: Normal range of motion.     Cervical back: Normal range of motion.  Skin:    General: Skin is warm and dry.  Neurological:     General: No focal deficit present.     Mental Status: She is alert and oriented to person, place, and time.  Psychiatric:        Mood and Affect: Mood normal.        Behavior: Behavior normal.      ASSESSMENT & PLAN: Marijose was seen today for annual exam and medication refill.  Diagnoses and all orders for this visit:  Routine general medical examination at a health care facility  Hypothyroidism, unspecified type -     levothyroxine (SYNTHROID) 175 MCG tablet; Take 1 tablet (175 mcg total) by mouth daily before breakfast. -     TSH -     Hemoglobin A1c -     Comprehensive metabolic panel -     CBC with Differential/Platelet -     Alpha-1-antitrypsin  Screening for deficiency anemia  Screening for lipoid disorders  Screening for endocrine, metabolic and immunity disorder   Modifiable risk factors  discussed with patient. Anticipatory guidance according to age provided. The following topics were discussed: Smoking Hypothyroidism on treatment and need for TSH level Diet and nutrition Benefits of exercise Cancer screening and review of recent colonoscopy Vaccinations Cardiovascular risk assessment Mental health including depression and anxiety Fall and accident prevention     Patient Instructions  Hipotiroidismo Hypothyroidism  El hipotiroidismo ocurre cuando la glndula tiroidea no produce la cantidad suficiente de ciertas hormonas (es hipoactiva). La glndula tiroidea es una pequea glndula ubicada en la parte delantera inferior del cuello, justo delante de la trquea. Esta glndula produce hormonas que ayudan a Mudlogger en la que el cuerpo [place] los alimentos para obtener energa (metabolismo) as como tambin la funcin cardaca  y la funcin cerebral. Estas hormonas tambin juegan un papel para [employer]. Cuando la tiroides es hipoactiva, produce muy poca cantidad de las hormonas tiroxina (T4) y triyodotironina (T3). Cules son las causas? Esta afeccin puede ser causada por lo siguiente:  Enfermedad de Hashimoto. Se trata de una enfermedad por la cual el sistema del cuerpo encargado de combatir las enfermedades (sistema inmunitario) ataca la glndula tiroidea. Esta es la causa ms frecuente.  Infecciones virales.  Embarazo.  Ciertos medicamentos.  Defectos congnitos.  Radioterapias anteriores en la cabeza o el cuello para Science writer.  Tratamiento previo con yodo radioactivo.  Exposicin a la radiacin en el ambiente, en el pasado.  Extirpacin quirrgica previa de una parte o de toda la tiroides.  Problemas con Ardelia Mems glndula ubicada en el centro del cerebro (hipfisis).  Falta de una cantidad suficiente de yodo en la dieta. Qu incrementa el riesgo? Es ms probable que sufra esta afeccin si:  Es mujer.  Tiene antecedentes  familiares de afecciones tiroideas.  [place] un medicamento denominado litio.  Toma medicamentos que afectan el sistema inmunitario (inmunosupresores). Cules son los signos o sntomas? Los sntomas de esta afeccin incluyen:  Sensacin de falta de energa (letargo).  Incapacidad para tolerar el fro.  Aumento de peso que no puede explicarse por un cambio en la dieta o en los hbitos de ejercicio fsico.  Falta de apetito.  Piel seca.  Pelo grueso.  Irregularidades menstruales.  Ralentizacin de los procesos de pensamiento.  Estreimiento.  Tristeza o depresin. Cmo se diagnostica? Esta afeccin se puede diagnosticar en funcin de lo siguiente:  Los sntomas, los antecedentes mdicos y un examen fsico.  Anlisis de Biochemist, clinical. Tambin puede someterse a estudios por imgenes como una ecografa o una resonancia magntica (RM). Cmo se trata? Esta afeccin se trata con medicamentos que reemplazan las hormonas tiroideas que el cuerpo no produce. Despus de Biochemist, clinical, pueden pasar varias semanas hasta la desaparicin de los sntomas. Siga estas instrucciones en su casa:  Use los medicamentos de venta libre y los recetados solamente como se lo haya indicado el mdico.  Si empieza a tomar medicamentos nuevos, infrmele al mdico.  Consulting civil engineer a todas las visitas de seguimiento como se lo haya indicado el mdico. Esto es importante. ? A medida que la afeccin mejora, es posible que haya que modificar las dosis de los medicamentos de hormona tiroidea. ? Tendr que hacerse anlisis de sangre peridicamente, de modo que el mdico pueda Electrical engineer. Comunquese con un mdico si:  Los sntomas no mejoran con Dispensing optician.  Est tomando medicamentos de reemplazo de la hormona tiroidea y: ? [place]. ? Tiene temblores. ? Se siente ansioso. ? [place] de peso rpidamente. ? No puede Agricultural engineer. ? Tiene cambios emocionales. ? Tiene diarrea. ? Se siente  dbil. Solicite ayuda de inmediato si tiene:  Dolor de Engineer, building services.  Latidos cardacos irregulares.  Latidos cardacos rpidos.  Dificultad para respirar. Resumen  El hipotiroidismo ocurre cuando la glndula tiroidea no produce la cantidad suficiente de ciertas hormonas (es hipoactiva).  Cuando la tiroides es hipoactiva, produce muy poca cantidad de las hormonas tiroxina (T4) y triyodotironina (T3).  La causa ms frecuente es la enfermedad de Hashimoto, una enfermedad por la cual el sistema del cuerpo encargado de combatir las enfermedades (sistema inmunitario) ataca la glndula tiroidea. La afeccin tambin puede ser consecuencia de infecciones virales, medicamentos, el embarazo o luego de un tratamiento de radioterapia en la cabeza o el cuello.  Los sntomas pueden incluir aumento de [place], piel seca, estreimiento, sensacin de no tener energa e incapacidad para [employer] fro.  Esta afeccin se trata con medicamentos que reemplazan las hormonas tiroideas que el cuerpo no produce. Esta informacin no tiene Marine scientist el consejo del mdico. Asegrese de hacerle al mdico cualquier pregunta que tenga. Document Revised: 08/15/2020 Document Reviewed: 07/19/2020 Elsevier Patient Education  2021 [place], MD [facility]

## 2021-03-25 NOTE — Patient Instructions (Signed)
Hipotiroidismo Hypothyroidism  El hipotiroidismo ocurre cuando la glndula tiroidea no produce la cantidad suficiente de ciertas hormonas (es hipoactiva). La glndula tiroidea es una pequea glndula ubicada en la parte delantera inferior del cuello, justo delante de la trquea. Esta glndula produce hormonas que ayudan a Aeronautical engineer forma en la que el cuerpo [place] los alimentos para obtener energa (metabolismo) as como tambin la funcin cardaca y la funcin cerebral. Estas hormonas tambin juegan un papel para [employer] huesos fuertes. Cuando la tiroides es hipoactiva, produce muy poca cantidad de las hormonas tiroxina (T4) y triyodotironina (T3). Cules son las causas? Esta afeccin puede ser causada por lo siguiente:  Enfermedad de Hashimoto. Se trata de una enfermedad por la cual el sistema del cuerpo encargado de combatir las enfermedades (sistema inmunitario) ataca la glndula tiroidea. Esta es la causa ms frecuente.  Infecciones virales.  Embarazo.  Ciertos medicamentos.  Defectos congnitos.  Radioterapias anteriores en la cabeza o el cuello para Science writer.  Tratamiento previo con yodo radioactivo.  Exposicin a la radiacin en el ambiente, en el pasado.  Extirpacin quirrgica previa de una parte o de toda la tiroides.  Problemas con Ardelia Mems glndula ubicada en el centro del cerebro (hipfisis).  Falta de una cantidad suficiente de yodo en la dieta. Qu incrementa el riesgo? Es ms probable que sufra esta afeccin si:  Es mujer.  Tiene antecedentes familiares de afecciones tiroideas.  [place] un medicamento denominado litio.  Toma medicamentos que afectan el sistema inmunitario (inmunosupresores). Cules son los signos o sntomas? Los sntomas de esta afeccin incluyen:  Sensacin de falta de energa (letargo).  Incapacidad para tolerar el fro.  Aumento de peso que no puede explicarse por un cambio en la dieta o en los hbitos de ejercicio fsico.  Falta de  apetito.  Piel seca.  Pelo grueso.  Irregularidades menstruales.  Ralentizacin de los procesos de pensamiento.  Estreimiento.  Tristeza o depresin. Cmo se diagnostica? Esta afeccin se puede diagnosticar en funcin de lo siguiente:  Los sntomas, los antecedentes mdicos y un examen fsico.  Anlisis de Biochemist, clinical. Tambin puede someterse a estudios por imgenes como una ecografa o una resonancia magntica (RM). Cmo se trata? Esta afeccin se trata con medicamentos que reemplazan las hormonas tiroideas que el cuerpo no produce. Despus de Biochemist, clinical, pueden pasar varias semanas hasta la desaparicin de los sntomas. Siga estas instrucciones en su casa:  Use los medicamentos de venta libre y los recetados solamente como se lo haya indicado el mdico.  Si empieza a tomar medicamentos nuevos, infrmele al mdico.  Consulting civil engineer a todas las visitas de seguimiento como se lo haya indicado el mdico. Esto es importante. ? A medida que la afeccin mejora, es posible que haya que modificar las dosis de los medicamentos de hormona tiroidea. ? Tendr que hacerse anlisis de sangre peridicamente, de modo que el mdico pueda Electrical engineer. Comunquese con un mdico si:  Los sntomas no mejoran con Dispensing optician.  Est tomando medicamentos de reemplazo de la hormona tiroidea y: ? [place]. ? Tiene temblores. ? Se siente ansioso. ? [place] rpidamente. ? No puede Agricultural engineer. ? Tiene cambios emocionales. ? Tiene diarrea. ? Se siente dbil. Solicite ayuda de inmediato si tiene:  Dolor de Engineer, building services.  Latidos cardacos irregulares.  Latidos cardacos rpidos.  Dificultad para respirar. Resumen  El hipotiroidismo ocurre cuando la glndula tiroidea no produce la cantidad suficiente de ciertas hormonas (es hipoactiva).  Cuando la tiroides es hipoactiva, produce  muy poca cantidad de las hormonas tiroxina (T4) y triyodotironina (T3).  La causa ms  frecuente es la enfermedad de Hashimoto, una enfermedad por la cual el sistema del cuerpo encargado de combatir las enfermedades (sistema inmunitario) ataca la glndula tiroidea. La afeccin tambin puede ser consecuencia de infecciones virales, medicamentos, el embarazo o luego de un tratamiento de radioterapia en la cabeza o el cuello.  Los sntomas pueden incluir aumento de [place], piel seca, estreimiento, sensacin de no tener energa e incapacidad para [employer] fro.  Esta afeccin se trata con medicamentos que reemplazan las hormonas tiroideas que el cuerpo no produce. Esta informacin no tiene Marine scientist el consejo del mdico. Asegrese de hacerle al mdico cualquier pregunta que tenga. Document Revised: 08/15/2020 Document Reviewed: 07/19/2020 Elsevier Patient Education  2021 [employer].

## 2021-03-26 LAB — ALPHA-1-ANTITRYPSIN: A-1 Antitrypsin, Ser: 122 mg/dL (ref 83–199)

## 2021-05-05 ENCOUNTER — Encounter: Payer: Self-pay | Admitting: Obstetrics & Gynecology

## 2021-05-05 ENCOUNTER — Other Ambulatory Visit: Payer: Self-pay

## 2021-05-05 ENCOUNTER — Ambulatory Visit (INDEPENDENT_AMBULATORY_CARE_PROVIDER_SITE_OTHER): Payer: 59 | Admitting: Obstetrics & Gynecology

## 2021-05-05 VITALS — BP 110/70 | HR 86 | Resp 14 | Ht 64.5 in | Wt 144.0 lb

## 2021-05-05 DIAGNOSIS — R1032 Left lower quadrant pain: Secondary | ICD-10-CM | POA: Diagnosis not present

## 2021-05-05 DIAGNOSIS — Z01419 Encounter for gynecological examination (general) (routine) without abnormal findings: Secondary | ICD-10-CM | POA: Diagnosis not present

## 2021-05-05 DIAGNOSIS — N979 Female infertility, unspecified: Secondary | ICD-10-CM | POA: Diagnosis not present

## 2021-05-05 NOTE — Progress Notes (Signed)
Brittney Pena 12/03/1975 [id number]   History:    45 y.o.  [id number] Stable boyfriend x 5 years.  RP: Established patient presenting for annual routine gynecologic exam.   HPI: Menses regular normal every month. Intermittent left pelvic pain. Had Left Salpingectomy by LPS in 2020 for a ruptured left ectopic.  Pelvic ultrasound 02/29/2020 wnl except possible adenomyosis.  CT scan 12/2020 showed no acute findings.  Uterus normal, no adnexal mass.  Attempting conception x 5 years.  She did have a ruptured ectopic pregnancy last year 01/2019 resolved by laparoscopic left salpingectomy with evacuation of hemoperitoneum.  During that surgery general surgery was called in to evaluate the sigmoid colon and and it was deemed grossly normal during inspection per the notes.   Past medical history,surgical history, family history and social history were all reviewed and documented in the EPIC chart.  Gynecologic History Patient's last menstrual period was 04/12/2021.  Obstetric History OB History  Gravida Para Term Preterm AB Living  6 2 2  0 2 2  SAB IAB Ectopic Multiple Live Births  1 1 0 0 2    # Outcome Date GA Lbr Len/2nd Weight Sex Delivery Anes PTL Lv  6 Gravida           5 Gravida           4 IAB           3 SAB           2 Term      CS-LTranv     1 Term              ROS: A ROS was performed and pertinent positives and negatives are included in the history.  GENERAL: No fevers or chills. HEENT: No change in vision, no earache, sore throat or sinus congestion. NECK: No pain or stiffness. CARDIOVASCULAR: No chest pain or pressure. No palpitations. PULMONARY: No shortness of breath, cough or wheeze. GASTROINTESTINAL: No abdominal pain, nausea, vomiting or diarrhea, melena or bright red blood per rectum. GENITOURINARY: No urinary frequency, urgency, hesitancy or dysuria. MUSCULOSKELETAL: No joint or muscle pain, no back pain, no recent trauma. DERMATOLOGIC: No rash, no itching, no lesions.  ENDOCRINE: No polyuria, polydipsia, no heat or cold intolerance. No recent change in weight. HEMATOLOGICAL: No anemia or easy bruising or bleeding. NEUROLOGIC: No headache, seizures, numbness, tingling or weakness. PSYCHIATRIC: No depression, no loss of interest in normal activity or change in sleep pattern.     Exam:   BP 110/70 (BP Location: Right Arm, Patient Position: Sitting, Cuff Size: Normal)   Pulse 86   Resp 14   Ht 5' 4.5" (1.638 m)   Wt 144 lb (65.3 kg)   LMP 04/12/2021   BMI 24.34 kg/m   Body mass index is 24.34 kg/m.  General appearance : Well developed well nourished female. No acute distress HEENT: Eyes: no retinal hemorrhage or exudates,  Neck supple, trachea midline, no carotid bruits, no thyroidmegaly Lungs: Clear to auscultation, no rhonchi or wheezes, or rib retractions  Heart: Regular rate and rhythm, no murmurs or gallops Breast:Examined in sitting and supine position were symmetrical in appearance, no palpable masses or tenderness,  no skin retraction, no nipple inversion, no nipple discharge, no skin discoloration, no axillary or supraclavicular lymphadenopathy Abdomen: no palpable masses or tenderness, no rebound or guarding Extremities: no edema or skin discoloration or tenderness  Pelvic: Vulva: Normal             Vagina: No  gross lesions or discharge  Cervix: No gross lesions or discharge  Uterus  AV, normal size, shape and consistency, non-tender and mobile  Adnexa  Without masses or tenderness  Anus: Normal   Assessment/Plan:  45 y.o. female for annual exam   1. Well female exam with routine gynecological exam Normal gynecologic exam.  Last Pap test negative April 2021, no indication to repeat this year.  Breast exam normal.  Overdue for screening mammogram, will schedule now.  Health labs with family physician.  Good body mass index of 24.34.  Continue with fitness and healthy nutrition.  2. Female infertility, secondary Secondary infertility  with new partner x5 years.  Advanced maternal age at 54.  We will draw an AMH today.  History of left ectopic pregnancy for which a left salpingectomy was performed.  Recommend doing a hysterosalpingogram under Doxy.  Semen analysis recommended.   - Anti-Mullerian Hormone [facility]), Female - DG Hysterogram (HSG); Future - Semen Analysis, Basic  3. LLQ abdominal pain  Previous pelvic ultrasound and CT scan negative.  Normal gynecologic exam today.  Princess Bruins MD, 2:56 PM 05/05/2021

## 2021-05-14 ENCOUNTER — Telehealth: Payer: Self-pay | Admitting: Anesthesiology

## 2021-05-14 NOTE — Telephone Encounter (Signed)
Dr. Dellis Filbert I called Raylie regarding this message  "Schedule HSG under Doxy after next period.  Sperm analysis for partner. Note that HSG not discussed with patient, but I recommend to proceed.  I sawthat she had a left ruptured ectopic with Left Salpingectomy after the visit."  Patient said that she does not want to continue with this plan that you both had discussed. She will call back if she decides to do it.

## 2021-09-24 ENCOUNTER — Ambulatory Visit (INDEPENDENT_AMBULATORY_CARE_PROVIDER_SITE_OTHER): Payer: 59

## 2021-09-24 ENCOUNTER — Other Ambulatory Visit: Payer: Self-pay

## 2021-09-24 ENCOUNTER — Ambulatory Visit (INDEPENDENT_AMBULATORY_CARE_PROVIDER_SITE_OTHER): Payer: 59 | Admitting: Emergency Medicine

## 2021-09-24 ENCOUNTER — Encounter: Payer: Self-pay | Admitting: Emergency Medicine

## 2021-09-24 VITALS — BP 110/62 | HR 75 | Ht 64.0 in | Wt 140.0 lb

## 2021-09-24 DIAGNOSIS — E039 Hypothyroidism, unspecified: Secondary | ICD-10-CM

## 2021-09-24 DIAGNOSIS — Z8709 Personal history of other diseases of the respiratory system: Secondary | ICD-10-CM | POA: Diagnosis not present

## 2021-09-24 MED ORDER — LEVOTHYROXINE SODIUM 150 MCG PO TABS
150.0000 ug | ORAL_TABLET | Freq: Every day | ORAL | 3 refills | Status: DC
Start: 2021-09-24 — End: 2022-09-25

## 2021-09-24 NOTE — Assessment & Plan Note (Signed)
Clinically euthyroid.  Last TSH was on the low side.  We will repeat today. Refill Synthroid at 150 mcg daily.  Lower dose than before.

## 2021-09-24 NOTE — Patient Instructions (Signed)
Hipotiroidismo Hypothyroidism El hipotiroidismo ocurre cuando la glndula tiroidea no produce la cantidad suficiente de ciertas hormonas (es hipoactiva). La glndula tiroidea es una pequea glndula ubicada en la parte delantera inferior del cuello, justo delante de la trquea. Esta glndula produce hormonas que ayudan a Aeronautical engineer forma en la que el cuerpo [place] los alimentos para obtener energa (metabolismo) as como tambin la funcin cardaca y la funcin cerebral. Estas hormonas tambin juegan un papel para [employer] huesos fuertes. Cuando la tiroides es hipoactiva, produce muy poca cantidad de las hormonas tiroxina (T4) y triyodotironina (T3). Cules son las causas? Esta afeccin puede ser causada por lo siguiente: Enfermedad de Hashimoto. Se trata de una enfermedad por la cual el sistema del cuerpo encargado de combatir las enfermedades (sistema inmunitario) ataca la glndula tiroidea. Esta es la causa ms frecuente. Infecciones virales. Embarazo. Ciertos medicamentos. Defectos congnitos. Radioterapias anteriores en la cabeza o el cuello para Science writer. Tratamiento previo con yodo radioactivo. Exposicin a la radiacin en el ambiente, en el pasado. Extirpacin quirrgica previa de una parte o de toda la tiroides. Problemas con Ardelia Mems glndula ubicada en el centro del cerebro (hipfisis). Falta de una cantidad suficiente de yodo en la dieta. Qu incrementa el riesgo? Es ms probable que sufra esta afeccin si: Es mujer. Tiene antecedentes familiares de afecciones tiroideas. [place] un medicamento denominado litio. Toma medicamentos que afectan el sistema inmunitario (inmunosupresores). Cules son los signos o sntomas? Los sntomas de esta afeccin incluyen: Sensacin de falta de energa (letargo). Incapacidad para tolerar el fro. Aumento de peso que no puede explicarse por un cambio en la dieta o en los hbitos de ejercicio fsico. Falta de apetito. Piel seca. Pelo  grueso. Irregularidades menstruales. Ralentizacin de los procesos de pensamiento. Estreimiento. Tristeza o depresin. Cmo se diagnostica? Esta afeccin se puede diagnosticar en funcin de lo siguiente: Los sntomas, los antecedentes mdicos y un examen fsico. Anlisis de Biochemist, clinical. Tambin puede someterse a estudios por imgenes como una ecografa o una resonancia magntica (RM). Cmo se trata? Esta afeccin se trata con medicamentos que reemplazan las hormonas tiroideas que el cuerpo no produce. Despus de Biochemist, clinical, pueden pasar varias semanas hasta la desaparicin de los sntomas. Siga estas instrucciones en su casa: Use los medicamentos de venta libre y los recetados solamente como se lo haya indicado el mdico. Si empieza a tomar medicamentos nuevos, infrmele al mdico. Consulting civil engineer a todas las visitas de seguimiento como se lo haya indicado el mdico. Esto es importante. A medida que la afeccin mejora, es posible que haya que modificar las dosis de los medicamentos de hormona tiroidea. Tendr que hacerse anlisis de sangre peridicamente, de modo que el mdico pueda Electrical engineer. Comunquese con un mdico si: Los sntomas no mejoran con Dispensing optician. Est tomando medicamentos de reemplazo de la hormona tiroidea y: [employer]. Tiene temblores. Se siente ansioso. [place] de pes[place] rpidamente. No puede Agricultural engineer. Tiene cambios emocionales. Tiene diarrea. Se siente dbil. Solicite ayuda de inmediato si tiene: Dolor de Engineer, building services. Latidos cardacos irregulares. Latidos cardacos rpidos. Dificultad para respirar. Resumen El hipotiroidismo ocurre cuando la glndula tiroidea no produce la cantidad suficiente de ciertas hormonas (es hipoactiva). Cuando la tiroides es hipoactiva, produce muy poca cantidad de las hormonas tiroxina (T4) y triyodotironina (T3). La causa ms frecuente es la enfermedad de Hashimoto, una enfermedad por la cual el sistema del cuerpo  encargado de combatir las enfermedades (sistema inmunitario) ataca la glndula tiroidea. La afeccin tambin puede ser consecuencia de infecciones  virales, medicamentos, el embarazo o luego de un tratamiento de radioterapia en la cabeza o el cuello. Los sntomas pueden incluir aumento de [place], piel seca, estreimiento, sensacin de no tener energa e incapacidad para [employer] fro. Esta afeccin se trata con medicamentos que reemplazan las hormonas tiroideas que el cuerpo no produce. Esta informacin no tiene Marine scientist el consejo del mdico. Asegrese de hacerle al mdico cualquier pregunta que tenga. Document Revised: 08/15/2020 Document Reviewed: 07/19/2020 Elsevier Patient Education  2022 [employer].

## 2021-09-24 NOTE — Assessment & Plan Note (Addendum)
Questionable history.  Doubt the diagnosis.  Non-smoker. Chest x-ray done today. Normal alpha-1 antitrypsin levels.

## 2021-09-24 NOTE — Progress Notes (Signed)
Brittney Pena 45 y.o.   Chief Complaint  Patient presents with   Hypothyroidism    F/U    HISTORY OF PRESENT ILLNESS: This is a 45 y.o. female with history of hypothyroidism on Synthroid 175 mcg daily here for follow-up. Run out of medication.  Has been off Synthroid for about 12 days. Has remote history of "possible emphysema" stemming from emergency room visits at least 10 years ago. Not sure this is the case.  Non-smoker.  No symptoms. Alpha-1 antitrypsin test done last May was within normal limits. Otherwise doing well.  No other complaints or medical concerns today. Lab Results  Component Value Date   TSH 0.43 03/25/2021     HPI   Prior to Admission medications   Medication Sig Start Date End Date Taking? Authorizing Provider  GLUCOSAMINE-CHONDROITIN PO Take by mouth daily.   Yes [provider]  Nyoka Cowden Tea, Camellia sinensis, (GREEN TEA PO) Take by mouth daily.   Yes [provider]  levothyroxine (SYNTHROID) 175 MCG tablet Take 1 tablet (175 mcg total) by mouth daily before breakfast. 03/25/21  Yes Horald Pollen, MD    No Known Allergies  Patient Active Problem List   Diagnosis Date Noted   Rectal bleeding 09/05/2020   LLQ abdominal pain 09/05/2020   Women's annual routine gynecological examination 02/07/2020   Menorrhagia with regular cycle 02/07/2020   Screen for STD (sexually transmitted disease) 02/07/2020   Screening for metabolic disorder 78/46/9629   Hypothyroidism 02/07/2020   Encounter for screening mammogram for malignant neoplasm of breast 02/07/2020   Ruptured ectopic pregnancy 02/15/2019   Cluster headache 04/11/2015   Galactorrhea 04/11/2015   DUB (dysfunctional uterine bleeding) 04/11/2015   Other specified hypothyroidism 04/11/2015    Past Medical History:  Diagnosis Date   Hypothyroidism    Thyroid disease     Past Surgical History:  Procedure Laterality Date   BREAST BIOPSY Right    needle core biopsy, benign    CESAREAN SECTION     x1   DIAGNOSTIC LAPAROSCOPY WITH REMOVAL OF ECTOPIC PREGNANCY N/A 02/15/2019   Procedure: DIAGNOSTIC LAPAROSCOPY WITH REMOVAL OF RUPTURED ECTOPIC PREGNANCY AND EVACUATION OF HEMOPERITONEUM;  Surgeon: Sloan Leiter, MD;  Location: [facility];  Service: Gynecology;  Laterality: N/A;   LAPAROSCOPIC LYSIS OF ADHESIONS N/A 02/15/2019   Procedure: Laparoscopic Lysis Of Adhesions;  Surgeon: Sloan Leiter, MD;  Location: [facility];  Service: Gynecology;  Laterality: N/A;   LAPAROSCOPIC UNILATERAL SALPINGECTOMY Left 02/15/2019   Procedure: Laparoscopic Unilateral Salpingectomy;  Surgeon: Sloan Leiter, MD;  Location: [facility];  Service: Gynecology;  Laterality: Left;    Social History   Socioeconomic History   Marital status: Single    Spouse name: Not on file   Number of children: 2   Years of education: Not on file   Highest education level: Not on file  Occupational History   Not on file  Tobacco Use   Smoking status: Former    Packs/day: 0.00    Types: Cigarettes   Smokeless tobacco: Never   Tobacco comments:    refused to answer question   Vaping Use   Vaping Use: Never used  Substance and Sexual Activity   Alcohol use: Yes    Alcohol/week: 0.0 standard drinks    Comment: Social   Drug use: No   Sexual activity: Yes    Partners: Male    Birth control/protection: Condom    Comment: pregnant-ectopic preg  Other Topics Concern   Not on  file  Social History Narrative   Pt is from [place]. She has lived in [place] for 10 years. Her 2 children live with her.    Social Determinants of Health   Financial Resource Strain: Not on file  Food Insecurity: Not on file  Transportation Needs: Not on file  Physical Activity: Not on file  Stress: Not on file  Social Connections: Not on file  Intimate Partner Violence: Not on file    Family History  Problem Relation Age of Onset   Colon cancer Father    Breast cancer Maternal Aunt 32   Stomach cancer Neg Hx     Rectal cancer Neg Hx    Esophageal cancer Neg Hx      Review of Systems  Constitutional: Negative.  Negative for chills and fever.  HENT: Negative.  Negative for congestion and sore throat.   Respiratory: Negative.  Negative for cough and shortness of breath.   Cardiovascular: Negative.  Negative for chest pain and palpitations.  Gastrointestinal: Negative.  Negative for abdominal pain, diarrhea, nausea and vomiting.  Genitourinary: Negative.   Musculoskeletal: Negative.   Skin: Negative.  Negative for rash.  Neurological: Negative.  Negative for dizziness and headaches.  All other systems reviewed and are negative.  Today's Vitals   09/24/21 1602  BP: 110/62  Pulse: 75  SpO2: 97%  Weight: 140 lb (63.5 kg)  Height: 5\' 4"  (1.626 m)   Body mass index is 24.03 kg/m.  Physical Exam Vitals reviewed.  Constitutional:      Appearance: Normal appearance.  HENT:     Head: Normocephalic.  Eyes:     Extraocular Movements: Extraocular movements intact.     Pupils: Pupils are equal, round, and reactive to light.  Cardiovascular:     Rate and Rhythm: Normal rate and regular rhythm.     Pulses: Normal pulses.     Heart sounds: Normal heart sounds.  Pulmonary:     Effort: Pulmonary effort is normal.     Breath sounds: Normal breath sounds.  Musculoskeletal:     Cervical back: Normal range of motion and neck supple. No tenderness.     Right lower leg: No edema.     Left lower leg: No edema.  Lymphadenopathy:     Cervical: No cervical adenopathy.  Skin:    General: Skin is warm and dry.     Capillary Refill: Capillary refill takes less than 2 seconds.  Neurological:     General: No focal deficit present.     Mental Status: She is alert and oriented to person, place, and time.  Psychiatric:        Mood and Affect: Mood normal.        Behavior: Behavior normal.     ASSESSMENT & PLAN: Problem List Items Addressed This Visit       Endocrine   Hypothyroidism - Primary     Clinically euthyroid.  Last TSH was on the low side.  We will repeat today. Refill Synthroid at 150 mcg daily.  Lower dose than before.      Relevant Medications   levothyroxine (SYNTHROID) 150 MCG tablet   Other Relevant Orders   TSH   DG Chest 2 View   Comprehensive metabolic panel     Other   History of emphysema    Questionable history.  Doubt the diagnosis.  Non-smoker. Chest x-ray done today. Normal alpha-1 antitrypsin levels.      Patient Instructions  Hipotiroidismo Hypothyroidism El hipotiroidismo ocurre cuando la  glndula tiroidea no produce la cantidad suficiente de ciertas hormonas (es hipoactiva). La glndula tiroidea es una pequea glndula ubicada en la parte delantera inferior del cuello, justo delante de la trquea. Esta glndula produce hormonas que ayudan a Aeronautical engineer forma en la que el cuerpo [place] los alimentos para obtener energa (metabolismo) as como tambin la funcin cardaca y la funcin cerebral. Estas hormonas tambin juegan un papel para [employer] huesos fuertes. Cuando la tiroides es hipoactiva, produce muy poca cantidad de las hormonas tiroxina (T4) y triyodotironina (T3). Cules son las causas? Esta afeccin puede ser causada por lo siguiente: Enfermedad de Hashimoto. Se trata de una enfermedad por la cual el sistema del cuerpo encargado de combatir las enfermedades (sistema inmunitario) ataca la glndula tiroidea. Esta es la causa ms frecuente. Infecciones virales. Embarazo. Ciertos medicamentos. Defectos congnitos. Radioterapias anteriores en la cabeza o el cuello para Science writer. Tratamiento previo con yodo radioactivo. Exposicin a la radiacin en el ambiente, en el pasado. Extirpacin quirrgica previa de una parte o de toda la tiroides. Problemas con Ardelia Mems glndula ubicada en el centro del cerebro (hipfisis). Falta de una cantidad suficiente de yodo en la dieta. Qu incrementa el riesgo? Es ms probable que sufra esta afeccin si: Es  mujer. Tiene antecedentes familiares de afecciones tiroideas. [place] un medicamento denominado litio. Toma medicamentos que afectan el sistema inmunitario (inmunosupresores). Cules son los signos o sntomas? Los sntomas de esta afeccin incluyen: Sensacin de falta de energa (letargo). Incapacidad para tolerar el fro. Aumento de peso que no puede explicarse por un cambio en la dieta o en los hbitos de ejercicio fsico. Falta de apetito. Piel seca. Pelo grueso. Irregularidades menstruales. Ralentizacin de los procesos de pensamiento. Estreimiento. Tristeza o depresin. Cmo se diagnostica? Esta afeccin se puede diagnosticar en funcin de lo siguiente: Los sntomas, los antecedentes mdicos y un examen fsico. Anlisis de Biochemist, clinical. Tambin puede someterse a estudios por imgenes como una ecografa o una resonancia magntica (RM). Cmo se trata? Esta afeccin se trata con medicamentos que reemplazan las hormonas tiroideas que el cuerpo no produce. Despus de Biochemist, clinical, pueden pasar varias semanas hasta la desaparicin de los sntomas. Siga estas instrucciones en su casa: Use los medicamentos de venta libre y los recetados solamente como se lo haya indicado el mdico. Si empieza a tomar medicamentos nuevos, infrmele al mdico. Consulting civil engineer a todas las visitas de seguimiento como se lo haya indicado el mdico. Esto es importante. A medida que la afeccin mejora, es posible que haya que modificar las dosis de los medicamentos de hormona tiroidea. Tendr que hacerse anlisis de sangre peridicamente, de modo que el mdico pueda Electrical engineer. Comunquese con un mdico si: Los sntomas no mejoran con Dispensing optician. Est tomando medicamentos de reemplazo de la hormona tiroidea y: Santo Held. Tiene temblores. Se siente ansioso. [place]aja de peso rpidamente. No puede Agricultural engineer. Tiene cambios emocionales. Tiene diarrea. Se siente dbil. Solicite ayuda de  inmediato si tiene: Dolor de Engineer, building services. Latidos cardacos irregulares. Latidos cardacos rpidos. Dificultad para respirar. Resumen El hipotiroidismo ocurre cuando la glndula tiroidea no produce la cantidad suficiente de ciertas hormonas (es hipoactiva). Cuando la tiroides es hipoactiva, produce muy poca cantidad de las hormonas tiroxina (T4) y triyodotironina (T3). La causa ms frecuente es la enfermedad de Hashimoto, una enfermedad por la cual el sistema del cuerpo encargado de combatir las enfermedades (sistema inmunitario) ataca la glndula tiroidea. La afeccin tambin puede ser consecuencia de infecciones virales, medicamentos, el embarazo o luego de  un tratamiento de radioterapia en la cabeza o el cuello. Los sntomas pueden incluir aumento de [place], piel seca, estreimiento, sensacin de no tener energa e incapacidad para [employer] fro. Esta afeccin se trata con medicamentos que reemplazan las hormonas tiroideas que el cuerpo no produce. Esta informacin no tiene Marine scientist el consejo del mdico. Asegrese de hacerle al mdico cualquier pregunta que tenga. Document Revised: 08/15/2020 Document Reviewed: 07/19/2020 Elsevier Patient Education  2022 [place], MD [facility]

## 2021-09-25 ENCOUNTER — Other Ambulatory Visit: Payer: Self-pay | Admitting: Emergency Medicine

## 2021-09-25 DIAGNOSIS — E039 Hypothyroidism, unspecified: Secondary | ICD-10-CM

## 2021-09-25 LAB — COMPREHENSIVE METABOLIC PANEL
ALT: 15 U/L (ref 0–35)
AST: 19 U/L (ref 0–37)
Albumin: 4.5 g/dL (ref 3.5–5.2)
Alkaline Phosphatase: 51 U/L (ref 39–117)
BUN: 14 mg/dL (ref 6–23)
CO2: 32 mEq/L (ref 19–32)
Calcium: 9.8 mg/dL (ref 8.4–10.5)
Chloride: 101 mEq/L (ref 96–112)
Creatinine, Ser: 0.74 mg/dL (ref 0.40–1.20)
GFR: 97.8 mL/min (ref >60.00)
Glucose, Bld: 80 mg/dL (ref 70–99)
Potassium: 4 mEq/L (ref 3.5–5.1)
Sodium: 138 mEq/L (ref 135–145)
Total Bilirubin: 0.4 mg/dL (ref 0.2–1.2)
Total Protein: 7.7 g/dL (ref 6.0–8.3)

## 2021-09-25 LAB — TSH: TSH: 37.32 u[IU]/mL — ABNORMAL HIGH (ref 0.35–5.50)

## 2021-09-26 ENCOUNTER — Other Ambulatory Visit: Payer: Self-pay | Admitting: Emergency Medicine

## 2021-09-26 DIAGNOSIS — R0989 Other specified symptoms and signs involving the circulatory and respiratory systems: Secondary | ICD-10-CM

## 2021-10-14 ENCOUNTER — Ambulatory Visit (INDEPENDENT_AMBULATORY_CARE_PROVIDER_SITE_OTHER): Payer: 59 | Admitting: Pulmonary Disease

## 2021-10-14 ENCOUNTER — Encounter: Payer: Self-pay | Admitting: Pulmonary Disease

## 2021-10-14 ENCOUNTER — Other Ambulatory Visit: Payer: Self-pay

## 2021-10-14 VITALS — BP 118/64 | HR 75 | Temp 98.0°F | Ht 65.0 in | Wt 141.2 lb

## 2021-10-14 DIAGNOSIS — R0602 Shortness of breath: Secondary | ICD-10-CM | POA: Diagnosis not present

## 2021-10-14 DIAGNOSIS — R0989 Other specified symptoms and signs involving the circulatory and respiratory systems: Secondary | ICD-10-CM | POA: Diagnosis not present

## 2021-10-14 NOTE — Patient Instructions (Signed)
°  Spirometry -pre/post

## 2021-10-14 NOTE — Progress Notes (Signed)
Subjective:    Patient ID: Brittney Pena, female    DOB: 10/20/1976, 45 y.o.   MRN: [id number]  HPI   Chief Complaint  Patient presents with   Consult    Consult for pain in lungs and it comes and goes and has been going on for over 1 year     45 year old ex-smoker, Spanish-speaking presents for evaluation of episodic dyspnea. She runs housekeeping business and is originally from [place], living in [place] for 14 years.  History is obtained via Spanish interpreter  she reports episodic dyspnea that sometimes starts with a tingling sensation in the legs and moves around to her chest and then she feels like she cannot breathe.  Occasionally she has shoulder pain this has been going on for at least the last 5 to 6 years.  This can occur at rest or during exertion, does not wake her up from sleep. She had chest x-ray done at an emergency room visit few years ago and was told that look like emphysema.  She smoked about 15 pack years and quit at age 42.  Chest x-ray was obtained by PCP on 09/24/2021 which showed hyperinflation to my review and she was referred to [place].  She denies wheezing or cough.  She has a childhood history of bronchitis but was never diagnosed with asthma. She  denies significant stressors or formal diagnosis of anxiety or depression   Significant tests/ events reviewed Alpha-1 antitrypsin 02/2021 112    Past Medical History:  Diagnosis Date   Hypothyroidism    Thyroid disease    Past Surgical History:  Procedure Laterality Date   BREAST BIOPSY Right    needle core biopsy, benign   CESAREAN SECTION     x1   DIAGNOSTIC LAPAROSCOPY WITH REMOVAL OF ECTOPIC PREGNANCY N/A 02/15/2019   Procedure: DIAGNOSTIC LAPAROSCOPY WITH REMOVAL OF RUPTURED ECTOPIC PREGNANCY AND EVACUATION OF HEMOPERITONEUM;  Surgeon: Sloan Leiter, MD;  Location: [facility];  Service: Gynecology;  Laterality: N/A;   LAPAROSCOPIC LYSIS OF ADHESIONS N/A 02/15/2019   Procedure: Laparoscopic Lysis Of  Adhesions;  Surgeon: Sloan Leiter, MD;  Location: [facility];  Service: Gynecology;  Laterality: N/A;   LAPAROSCOPIC UNILATERAL SALPINGECTOMY Left 02/15/2019   Procedure: Laparoscopic Unilateral Salpingectomy;  Surgeon: Sloan Leiter, MD;  Location: [facility];  Service: Gynecology;  Laterality: Left;    No Known Allergies   Social History   Socioeconomic History   Marital status: Single    Spouse name: Not on file   Number of children: 2   Years of education: Not on file   Highest education level: Not on file  Occupational History   Not on file  Tobacco Use   Smoking status: Former    Packs/day: 0.00    Types: Cigarettes   Smokeless tobacco: Never   Tobacco comments:    refused to answer question   Vaping Use   Vaping Use: Never used  Substance and Sexual Activity   Alcohol use: Yes    Alcohol/week: 0.0 standard drinks    Comment: Social   Drug use: No   Sexual activity: Yes    Partners: Male    Birth control/protection: Condom    Comment: pregnant-ectopic preg  Other Topics Concern   Not on file  Social History Narrative   Pt is from [place]. She has lived in [place] for 10 years. Her 2 children live with her.    Social Determinants of Health   Financial Resource Strain: Not on file  Food Insecurity: Not on file  Transportation Needs: Not on file  Physical Activity: Not on file  Stress: Not on file  Social Connections: Not on file  Intimate Partner Violence: Not on file     Family History  Problem Relation Age of Onset   Colon cancer Father    Breast cancer Maternal Aunt 35   Stomach cancer Neg Hx    Rectal cancer Neg Hx    Esophageal cancer Neg Hx       Review of Systems Constitutional: negative for anorexia, fevers and sweats  Eyes: negative for irritation, redness and visual disturbance  Ears, nose, mouth, throat, and face: negative for earaches, epistaxis, nasal congestion and sore throat  Respiratory: negative for cough, sputum and wheezing   Cardiovascular: negative for chest pain, lower extremity edema, orthopnea, palpitations and syncope  Gastrointestinal: negative for abdominal pain, constipation, diarrhea, melena, nausea and vomiting  Genitourinary:negative for dysuria, frequency and hematuria  Hematologic/lymphatic: negative for bleeding, easy bruising and lymphadenopathy  Musculoskeletal:negative for arthralgias, muscle weakness and stiff joints  Neurological: negative for coordination problems, gait problems, headaches and weakness  Endocrine: negative for diabetic symptoms including polydipsia, polyuria and weight loss     Objective:   Physical Exam  Gen. Pleasant, well-nourished, in no distress, normal affect ENT - no pallor,icterus, no post nasal drip Neck: No JVD, no thyromegaly, no carotid bruits Lungs: no use of accessory muscles, no dullness to percussion, clear without rales or rhonchi  Cardiovascular: Rhythm regular, heart sounds  normal, no murmurs or gallops, no peripheral edema Abdomen: soft and non-tender, no hepatosplenomegaly, BS normal. Musculoskeletal: No deformities, no cyanosis or clubbing Neuro:  alert, non focal       Assessment & Plan:   Episodic dyspnea -this seems to occur even at rest and seems to be unlikely cardiopulmonary cause.  Hyperinflation is noted on chest x-ray.  She smoked about 15 pack years, do not feel this is enough to attribute to emphysema or COPD alpha-1 antitrypsin level is normal she has been quit smoking for at least 8 years.  We will proceed with spirometry to establish lung function, history is not very suggestive of asthma and no clear triggers are apparent. She denies significant stressors but I still feel that anxiety is a probable cause here.  As such I would hold off on bronchodilators for fear of worsening her symptoms.  Her episodes now seem to be less frequent.  I think she simply needs reassurance that her lung function is normal

## 2021-11-03 ENCOUNTER — Other Ambulatory Visit (INDEPENDENT_AMBULATORY_CARE_PROVIDER_SITE_OTHER): Payer: 59

## 2021-11-03 ENCOUNTER — Other Ambulatory Visit: Payer: Self-pay

## 2021-11-03 DIAGNOSIS — E039 Hypothyroidism, unspecified: Secondary | ICD-10-CM | POA: Diagnosis not present

## 2021-11-03 LAB — TSH: TSH: 17.65 u[IU]/mL — ABNORMAL HIGH (ref 0.35–5.50)

## 2021-11-11 ENCOUNTER — Other Ambulatory Visit: Payer: Self-pay | Admitting: *Deleted

## 2021-11-11 DIAGNOSIS — R0602 Shortness of breath: Secondary | ICD-10-CM

## 2021-11-11 DIAGNOSIS — R0989 Other specified symptoms and signs involving the circulatory and respiratory systems: Secondary | ICD-10-CM

## 2021-11-12 ENCOUNTER — Other Ambulatory Visit: Payer: Self-pay

## 2021-11-12 ENCOUNTER — Ambulatory Visit (INDEPENDENT_AMBULATORY_CARE_PROVIDER_SITE_OTHER): Payer: 59 | Admitting: Pulmonary Disease

## 2021-11-12 DIAGNOSIS — R0989 Other specified symptoms and signs involving the circulatory and respiratory systems: Secondary | ICD-10-CM

## 2021-11-12 DIAGNOSIS — R0602 Shortness of breath: Secondary | ICD-10-CM

## 2021-11-12 LAB — PULMONARY FUNCTION TEST
FEF 25-75 Pre: 2.37 L/sec
FEF2575-%Pred-Pre: 78 %
FEV1-%Pred-Pre: 95 %
FEV1-Pre: 2.88 L
FEV1FVC-%Pred-Pre: 91 %
FEV6-%Pred-Pre: 105 %
FEV6-Pre: 3.87 L
FEV6FVC-%Pred-Pre: 102 %
FVC-%Pred-Pre: 103 %
FVC-Pre: 3.87 L
Pre FEV1/FVC ratio: 74 %
Pre FEV6/FVC Ratio: 100 %

## 2021-11-12 NOTE — Patient Instructions (Signed)
Spirometry pre/post performed today. 

## 2021-11-12 NOTE — Progress Notes (Signed)
Spirometry pre/post performed today. 

## 2022-03-24 ENCOUNTER — Ambulatory Visit: Payer: 59 | Admitting: Emergency Medicine

## 2022-04-08 ENCOUNTER — Ambulatory Visit: Payer: 59 | Admitting: Emergency Medicine

## 2022-04-08 ENCOUNTER — Encounter: Payer: Self-pay | Admitting: Emergency Medicine

## 2022-09-25 ENCOUNTER — Other Ambulatory Visit: Payer: Self-pay | Admitting: Emergency Medicine

## 2022-09-25 DIAGNOSIS — E039 Hypothyroidism, unspecified: Secondary | ICD-10-CM

## 2022-10-05 ENCOUNTER — Other Ambulatory Visit: Payer: Self-pay

## 2022-10-05 ENCOUNTER — Ambulatory Visit (INDEPENDENT_AMBULATORY_CARE_PROVIDER_SITE_OTHER): Payer: Commercial Managed Care - HMO | Admitting: Obstetrics and Gynecology

## 2022-10-05 ENCOUNTER — Other Ambulatory Visit (HOSPITAL_COMMUNITY)
Admission: RE | Admit: 2022-10-05 | Discharge: 2022-10-05 | Disposition: A | Discharge status: Home / Self Care | Payer: Commercial Managed Care - HMO | Source: Ambulatory Visit | Attending: Family Medicine | Admitting: Family Medicine

## 2022-10-05 VITALS — BP 118/73 | HR 85 | Wt 141.9 lb

## 2022-10-05 DIAGNOSIS — Z113 Encounter for screening for infections with a predominantly sexual mode of transmission: Secondary | ICD-10-CM

## 2022-10-05 DIAGNOSIS — R102 Pelvic and perineal pain: Secondary | ICD-10-CM | POA: Diagnosis not present

## 2022-10-05 DIAGNOSIS — Z3202 Encounter for pregnancy test, result negative: Secondary | ICD-10-CM

## 2022-10-05 DIAGNOSIS — Z3200 Encounter for pregnancy test, result unknown: Secondary | ICD-10-CM

## 2022-10-05 LAB — POCT PREGNANCY, URINE: Preg Test, Ur: NEGATIVE

## 2022-10-05 NOTE — Progress Notes (Signed)
Possible Pregnancy  Here today for pregnancy confirmation. UPT in office today is negative. Pt reports positive UPT at home last week. Patient reports regular, monthly menstrual periods that last 5 days. States her most recent period was 10 days late and heavier than normal. LMP was approx 09/23/22. Pt reports bleeding for 10 days. Pt reports ectopic pregnancy approx 2 years prior. States this required surgical removal. Pt is concerned she may be experiencing another ectopic pregnancy due to left sided lower abdominal pain and feeling of heaviness in pelvis. Reviewed this is not a concern with a negative UPT.   Pt requests STD screening today. Self swab instructions given and specimen obtained. Blood drawn by lab tech.    Currie Paris, MD to bedside for brief visit to reestablish patient with office. Provider recommends STD screening with vaginitis testing on vaginal swab. Provider recommends [place] and for pt to return for separate visit to discuss pelvic pain.  Encounter completed with Spanish interpreter [place].  Annabell Howells, RN 10/05/2022  1:43 PM

## 2022-10-06 ENCOUNTER — Other Ambulatory Visit: Payer: Self-pay | Admitting: Obstetrics and Gynecology

## 2022-10-06 ENCOUNTER — Telehealth: Payer: Self-pay

## 2022-10-06 DIAGNOSIS — B9689 Other specified bacterial agents as the cause of diseases classified elsewhere: Secondary | ICD-10-CM

## 2022-10-06 LAB — CERVICOVAGINAL ANCILLARY ONLY
Bacterial Vaginitis (gardnerella): POSITIVE — AB
Candida Glabrata: NEGATIVE
Candida Vaginitis: NEGATIVE
Chlamydia: NEGATIVE
Comment: NEGATIVE
Comment: NEGATIVE
Comment: NEGATIVE
Comment: NEGATIVE
Comment: NEGATIVE
Comment: NORMAL
Neisseria Gonorrhea: NEGATIVE
Trichomonas: NEGATIVE

## 2022-10-06 LAB — HEPATITIS C ANTIBODY: Hep C Virus Ab: NONREACTIVE

## 2022-10-06 LAB — HEPATITIS B SURFACE ANTIGEN: Hepatitis B Surface Ag: NEGATIVE

## 2022-10-06 LAB — HIV ANTIBODY (ROUTINE TESTING W REFLEX): HIV Screen 4th Generation wRfx: NONREACTIVE

## 2022-10-06 LAB — RPR: RPR Ser Ql: NONREACTIVE

## 2022-10-06 MED ORDER — METRONIDAZOLE 0.75 % VA GEL: 1.0000 | Freq: Every day | VAGINAL | 1 refills | Status: DC

## 2022-10-06 NOTE — Telephone Encounter (Addendum)
Called pt to schedule [place]. Pt requests appt next Wednesday, the latest possible. Latest option is 10/14/22 at 12:00. Pt to arrive at 1145 with full bladder. Instructions given with Spanish interpreter [place]. Pt states this appt does not fit her schedule. Number given to patient to schedule.

## 2022-10-07 ENCOUNTER — Telehealth: Payer: Self-pay

## 2022-10-07 NOTE — Telephone Encounter (Signed)
Called [place] with [place] interpreter, Eda. Informed her of results and that medication was sent into the pharmacy. Patient voiced understanding and had no further questions or concerns.   B'Aisha, CMA

## 2022-10-14 ENCOUNTER — Ambulatory Visit: Admission: RE | Admit: 2022-10-14 | Payer: Commercial Managed Care - HMO | Source: Ambulatory Visit

## 2022-11-11 ENCOUNTER — Other Ambulatory Visit: Payer: Self-pay

## 2022-11-11 ENCOUNTER — Ambulatory Visit: Payer: Commercial Managed Care - HMO | Admitting: Obstetrics and Gynecology

## 2022-11-26 ENCOUNTER — Ambulatory Visit
Admission: RE | Admit: 2022-11-26 | Discharge: 2022-11-26 | Disposition: A | Discharge status: Home / Self Care | Payer: 59 | Source: Ambulatory Visit | Attending: Obstetrics and Gynecology | Admitting: Obstetrics and Gynecology

## 2022-11-26 DIAGNOSIS — R102 Pelvic and perineal pain: Secondary | ICD-10-CM | POA: Diagnosis present

## 2022-12-01 ENCOUNTER — Encounter: Payer: Self-pay | Admitting: Obstetrics and Gynecology

## 2022-12-02 ENCOUNTER — Encounter: Payer: Self-pay | Admitting: Emergency Medicine

## 2022-12-07 ENCOUNTER — Other Ambulatory Visit: Payer: Self-pay

## 2022-12-07 ENCOUNTER — Other Ambulatory Visit (HOSPITAL_COMMUNITY)
Admission: RE | Admit: 2022-12-07 | Discharge: 2022-12-07 | Disposition: A | Discharge status: Home / Self Care | Payer: 59 | Source: Ambulatory Visit | Attending: Obstetrics and Gynecology | Admitting: Obstetrics and Gynecology

## 2022-12-07 ENCOUNTER — Ambulatory Visit: Payer: Commercial Managed Care - HMO | Admitting: Obstetrics and Gynecology

## 2022-12-07 ENCOUNTER — Encounter: Payer: Self-pay | Admitting: Obstetrics and Gynecology

## 2022-12-07 ENCOUNTER — Ambulatory Visit: Payer: 59 | Admitting: Obstetrics and Gynecology

## 2022-12-07 VITALS — BP 92/57 | HR 77 | Ht 66.0 in | Wt 141.5 lb

## 2022-12-07 DIAGNOSIS — E039 Hypothyroidism, unspecified: Secondary | ICD-10-CM

## 2022-12-07 DIAGNOSIS — Z789 Other specified health status: Secondary | ICD-10-CM | POA: Diagnosis not present

## 2022-12-07 DIAGNOSIS — N938 Other specified abnormal uterine and vaginal bleeding: Secondary | ICD-10-CM

## 2022-12-07 DIAGNOSIS — N83202 Unspecified ovarian cyst, left side: Secondary | ICD-10-CM | POA: Insufficient documentation

## 2022-12-07 NOTE — Progress Notes (Unsigned)
GYNECOLOGY VISIT  Patient name: Brittney Pena MRN [id number]  Date of birth: 29-Nov-1975 Chief Complaint:   No chief complaint on file.   History:  Brittney Pena is a 47 y.o. [phone] being seen today for follow up of pelvic pain and new onset  postcoital bleeding. .    Past Medical History:  Diagnosis Date   Hypothyroidism    Thyroid disease     Past Surgical History:  Procedure Laterality Date   BREAST BIOPSY Right    needle core biopsy, benign   CESAREAN SECTION     x1   DIAGNOSTIC LAPAROSCOPY WITH REMOVAL OF ECTOPIC PREGNANCY N/A 02/15/2019   Procedure: DIAGNOSTIC LAPAROSCOPY WITH REMOVAL OF RUPTURED ECTOPIC PREGNANCY AND EVACUATION OF HEMOPERITONEUM;  Surgeon: Sloan Leiter, MD;  Location: [facility];  Service: Gynecology;  Laterality: N/A;   LAPAROSCOPIC LYSIS OF ADHESIONS N/A 02/15/2019   Procedure: Laparoscopic Lysis Of Adhesions;  Surgeon: Sloan Leiter, MD;  Location: [facility];  Service: Gynecology;  Laterality: N/A;   LAPAROSCOPIC UNILATERAL SALPINGECTOMY Left 02/15/2019   Procedure: Laparoscopic Unilateral Salpingectomy;  Surgeon: Sloan Leiter, MD;  Location: [facility];  Service: Gynecology;  Laterality: Left;    The following portions of the patient's history were reviewed and updated as appropriate: allergies, current medications, past family history, past medical history, past social history, past surgical history and problem list.   Health Maintenance:   Last pap ***. Results were: {Pap findings:25134}. H/O abnormal pap: {yes/yes***/no:23866} Last mammogram: ***. Results were: {normal, abnormal, n/a:23837}. Family h/o breast cancer: {yes***/no:23838}   Review of Systems:  {Ros - complete:30496} Comprehensive review of systems was otherwise negative.   Objective:  Physical Exam BP (!) 92/57   Pulse 77   Ht 5' 6"$  (1.676 m)   Wt 141 lb 8 oz (64.2 kg)   LMP 11/27/2022 (Exact Date) Comment: lasted 5 days, moderate bleeding  Breastfeeding No   BMI 22.84 kg/m     Physical Exam   Labs and Imaging US PELVIC COMPLETE WITH TRANSVAGINAL  Result Date: 11/26/2022 CLINICAL DATA:  Pelvic pain, history of prior salpingectomy for ectopic pregnancy in 2020, LMP 11/25/2022 EXAM: TRANSABDOMINAL AND TRANSVAGINAL ULTRASOUND OF PELVIS TECHNIQUE: Both transabdominal and transvaginal ultrasound examinations of the pelvis were performed. Transabdominal technique was performed for global imaging of the pelvis including uterus, ovaries, adnexal regions, and pelvic cul-de-sac. It was necessary to proceed with endovaginal exam following the transabdominal exam to visualize the endometrium and ovaries. COMPARISON:  01/16/2021 FINDINGS: Uterus Measurements: 9.7 x 4.1 x 6.5 cm = volume: 134 mL. Anteverted. Heterogeneous myometrium. Small myometrial cyst anteriorly at lower uterine segment question related to Caesarean section. No additional uterine mass. Endometrium Thickness: 6 mm.  No endometrial fluid or mass Right ovary Measurements: 6.4 x 3.6 x 5.1 cm = volume: 61.2 mL. Complicated cyst RIGHT ovary 3.8 x 3.8 x 3.4 cm containing eccentric echogenicity which appears to represent complex nodularity and partial septations. Lesion is new since 2022. Cystic ovarian neoplasm not excluded. Left ovary Measurements: 3.6 x 2.4 x 3.4 cm = volume: 15.6 mL. Normal morphology without mass Other findings No free pelvic fluid or additional pelvic masses. IMPRESSION: Unremarkable uterus, endometrial complex, and LEFT ovary. Complicated cystic lesion of the RIGHT ovary 3.8 cm greatest size, containing eccentric echogenicity which appears to represent complex nodularity and partial septations; cystic ovarian neoplasm not excluded. Surgical evaluation recommended. Electronically Signed   By: Lavonia Dana M.D.   On: 11/26/2022 17:08  Assessment & Plan:   There are no diagnoses linked to this encounter.   *** Routine preventative health maintenance measures emphasized.  Darliss Cheney,  MD [facility]

## 2022-12-07 NOTE — Patient Instructions (Addendum)
[address] [facility] [address] a las 2:30 de la tarde. Por favor llega a 2 de la tarde con una vejiga llena.  Locacion: [address] (que se llama "[facility] [place]                   [place], [place] [address] Numero: [phone]

## 2022-12-08 LAB — OVARIAN MALIGNANCY RISK-ROMA
Cancer Antigen (CA) 125: 30.3 U/mL (ref 0.0–38.1)
HE4: 44.1 pmol/L (ref 0.0–63.6)
Postmenopausal ROMA: 1.6
Premenopausal ROMA: 0.59

## 2022-12-08 LAB — TSH+FREE T4
Free T4: 1.04 ng/dL (ref 0.82–1.77)
TSH: 21.3 u[IU]/mL — ABNORMAL HIGH (ref 0.450–4.500)

## 2022-12-08 LAB — POSTMENOPAUSAL INTERP: LOW

## 2022-12-08 LAB — PREMENOPAUSAL INTERP: LOW

## 2022-12-08 MED ORDER — DOXYCYCLINE HYCLATE 100 MG PO CAPS: 100.0000 mg | ORAL_CAPSULE | Freq: Two times a day (BID) | ORAL | 0 refills | Status: AC

## 2022-12-09 LAB — CYTOLOGY - PAP
Adequacy: ABSENT
Comment: NEGATIVE
Diagnosis: NEGATIVE
High risk HPV: NEGATIVE

## 2022-12-16 ENCOUNTER — Other Ambulatory Visit: Payer: Self-pay | Admitting: Obstetrics and Gynecology

## 2022-12-16 DIAGNOSIS — E039 Hypothyroidism, unspecified: Secondary | ICD-10-CM

## 2022-12-17 ENCOUNTER — Telehealth: Payer: Self-pay | Admitting: General Practice

## 2022-12-17 NOTE — Telephone Encounter (Signed)
-----   Message from Darliss Cheney, MD sent at 12/16/2022 11:13 AM EST ----- Admin: schedule follow up to discuss recommendation for surgery  Clinical: inform patient that referral to endocrine for management of hypothyroid put in. Labs regarding the ovarian cyst were normal and I would like to discuss removal of the ovarian cyst rather than repeat ultrasound.

## 2022-12-17 NOTE — Telephone Encounter (Signed)
Called patient with Eda assisting with spanish interpretation, no answer- left message to call us back for results.

## 2022-12-18 NOTE — Telephone Encounter (Signed)
Called pt with interpreter Rosemarie Ax to review provider recommendation, referral, and upcoming appt.

## 2023-01-20 ENCOUNTER — Other Ambulatory Visit: Payer: Self-pay | Admitting: Obstetrics and Gynecology

## 2023-01-20 ENCOUNTER — Ambulatory Visit: Payer: 59 | Admitting: Obstetrics and Gynecology

## 2023-01-20 DIAGNOSIS — N83201 Unspecified ovarian cyst, right side: Secondary | ICD-10-CM

## 2023-02-15 ENCOUNTER — Ambulatory Visit (HOSPITAL_COMMUNITY)
Admission: RE | Admit: 2023-02-15 | Discharge: 2023-02-15 | Disposition: A | Discharge status: Home / Self Care | Payer: 59 | Source: Ambulatory Visit | Attending: Obstetrics and Gynecology | Admitting: Obstetrics and Gynecology

## 2023-02-15 DIAGNOSIS — N83202 Unspecified ovarian cyst, left side: Secondary | ICD-10-CM | POA: Diagnosis not present

## 2023-02-17 ENCOUNTER — Encounter: Payer: Self-pay | Admitting: Obstetrics and Gynecology

## 2023-02-17 ENCOUNTER — Telehealth: Payer: Self-pay

## 2023-02-17 ENCOUNTER — Ambulatory Visit (INDEPENDENT_AMBULATORY_CARE_PROVIDER_SITE_OTHER): Payer: 59 | Admitting: Obstetrics and Gynecology

## 2023-02-17 ENCOUNTER — Other Ambulatory Visit: Payer: Self-pay

## 2023-02-17 VITALS — BP 98/69 | HR 88 | Wt 141.9 lb

## 2023-02-17 DIAGNOSIS — Z603 Acculturation difficulty: Secondary | ICD-10-CM

## 2023-02-17 DIAGNOSIS — E039 Hypothyroidism, unspecified: Secondary | ICD-10-CM

## 2023-02-17 DIAGNOSIS — N938 Other specified abnormal uterine and vaginal bleeding: Secondary | ICD-10-CM | POA: Diagnosis not present

## 2023-02-17 DIAGNOSIS — R102 Pelvic and perineal pain: Secondary | ICD-10-CM | POA: Diagnosis not present

## 2023-02-17 DIAGNOSIS — Z758 Other problems related to medical facilities and other health care: Secondary | ICD-10-CM

## 2023-02-17 MED ORDER — LEVOTHYROXINE SODIUM 175 MCG PO TABS: 175.0000 ug | ORAL_TABLET | Freq: Every day | ORAL | 1 refills | Status: DC

## 2023-02-17 NOTE — Progress Notes (Signed)
GYNECOLOGY VISIT  Patient name: Halana Deisher MRN [id number]  Date of birth: [birth year] Chief Complaint:   Ovarian Cyst and postcoital bleeding   History:  Marycarmen Hagey is a 47 y.o. [id number] being seen today for follow up ovarian cyst, pelvic pain and postcoital bleeding. Had US done 4/22. No longer having postcoital bleeding and pelvic pain has resolved.    Called radiology room and reviewed [place] results - right complex ovarian cyst resolved  Past Medical History:  Diagnosis Date   Hypothyroidism    Thyroid disease     Past Surgical History:  Procedure Laterality Date   BREAST BIOPSY Right    needle core biopsy, benign   CESAREAN SECTION     x1   DIAGNOSTIC LAPAROSCOPY WITH REMOVAL OF ECTOPIC PREGNANCY N/A 02/15/2019   Procedure: DIAGNOSTIC LAPAROSCOPY WITH REMOVAL OF RUPTURED ECTOPIC PREGNANCY AND EVACUATION OF HEMOPERITONEUM;  Surgeon: Conan Bowens, MD;  Location: [facility] OR;  Service: Gynecology;  Laterality: N/A;   LAPAROSCOPIC LYSIS OF ADHESIONS N/A 02/15/2019   Procedure: Laparoscopic Lysis Of Adhesions;  Surgeon: Conan Bowens, MD;  Location: [facility] OR;  Service: Gynecology;  Laterality: N/A;   LAPAROSCOPIC UNILATERAL SALPINGECTOMY Left 02/15/2019   Procedure: Laparoscopic Unilateral Salpingectomy;  Surgeon: Conan Bowens, MD;  Location: [facility] OR;  Service: Gynecology;  Laterality: Left;    The following portions of the patient's history were reviewed and updated as appropriate: allergies, current medications, past family history, past medical history, past social history, past surgical history and problem list.   Health Maintenance:   Last pap     Component Value Date/Time   DIAGPAP  12/07/2022 1452    - Negative for intraepithelial lesion or malignancy (NILM)   DIAGPAP  02/07/2020 1533    - Negative for intraepithelial lesion or malignancy (NILM)   HPVHIGH Negative 12/07/2022 1452   ADEQPAP  12/07/2022 1452    Satisfactory for evaluation; transformation zone component  ABSENT.   ADEQPAP  02/07/2020 1533    Satisfactory for evaluation; transformation zone component ABSENT.    High Risk HPV: Positive  Adequacy:  Satisfactory for evaluation, transformation zone component PRESENT  Diagnosis:  Atypical squamous cells of undetermined significance (ASC-US)    Review of Systems:  Pertinent items are noted in HPI. Comprehensive review of systems was otherwise negative.   Objective:  Physical Exam BP 98/69   Pulse 88   Wt 141 lb 14.4 oz (64.4 kg)   BMI 22.90 kg/m    Physical Exam Vitals and nursing note reviewed.  Constitutional:      Appearance: Normal appearance.  HENT:     Head: Normocephalic and atraumatic.  Pulmonary:     Effort: Pulmonary effort is normal.  Skin:    General: Skin is warm and dry.  Neurological:     General: No focal deficit present.     Mental Status: She is alert.  Psychiatric:        Mood and Affect: Mood normal.        Behavior: Behavior normal.        Thought Content: Thought content normal.        Judgment: Judgment normal.     Labs and Imaging US PELVIC COMPLETE WITH TRANSVAGINAL  Result Date: 02/17/2023 CLINICAL DATA:  Follow up ovarian cyst EXAM: TRANSABDOMINAL AND TRANSVAGINAL ULTRASOUND OF PELVIS TECHNIQUE: Both transabdominal and transvaginal ultrasound examinations of the pelvis were performed. Transabdominal technique was performed for global imaging of the pelvis including uterus, ovaries, adnexal regions, and pelvic  cul-de-sac. It was necessary to proceed with endovaginal exam following the transabdominal exam to visualize the endometrium and adnexa. COMPARISON:  Ultrasound 11/26/2022 FINDINGS: Uterus Measurements: 7.6 x 4.2 x 6.8 = volume: 113 mL. Slightly heterogeneous myometrium with some tram track shadowing areas. Please correlate for any evidence of fibroid infiltration Endometrium Thickness: 6 mm.  No focal abnormality visualized. Right ovary Measurements: 2.0 x 1.3 x 1.0 = volume: 1.4 mL. Normal  follicles are seen. Previous mass is no longer identified. This may have been a functional or hemorrhagic cyst. Left ovary Measurements: 2.7 x 0.9 x 1.7 = volume: 2.0 mL. Normal appearance/no adnexal mass. Other findings No abnormal free fluid. IMPRESSION: Previous complex right-sided ovarian cystic lesion is no longer seen. This may have been a functional hemorrhagic cyst. No new mass lesion or free fluid. Heterogeneous myometrium. Please correlate for evidence of fibroid infiltration Electronically Signed   By: Karen Kays M.D.   On: 02/17/2023 14:45       Assessment & Plan:   1. Pelvic pain Pain has resolved. Ovarian cyst resolved on [place], no additional follow up needed  2. DUB (dysfunctional uterine bleeding) Postcoital bleeding has resolved  3. Hypothyroidism, unspecified type Refill sent and new endocrine referral placed as well  - levothyroxine (SYNTHROID) 175 MCG tablet; Take 1 tablet (175 mcg total) by mouth daily.  Dispense: 60 tablet; Refill: 1 - Ambulatory referral to Endocrinology  4. Language barrier In person spanish interpreter used for encounter   Routine preventative health maintenance measures emphasized.  Lorriane Shire, MD Minimally Invasive Gynecologic Surgery Center for [facility], [facility]

## 2023-02-17 NOTE — Patient Instructions (Signed)
Endocrinology [place] [facility] [phone]

## 2023-02-17 NOTE — Telephone Encounter (Signed)
Attempt to call [facility] regarding patient referral on 12/07/2022.   No answer and LVM for a call back.    Felecia Shelling CMA

## 2023-04-19 ENCOUNTER — Telehealth: Payer: Self-pay | Admitting: Family Medicine

## 2023-04-19 DIAGNOSIS — E039 Hypothyroidism, unspecified: Secondary | ICD-10-CM

## 2023-04-19 MED ORDER — LEVOTHYROXINE SODIUM 175 MCG PO TABS: 175.0000 ug | ORAL_TABLET | Freq: Every day | ORAL | 1 refills | Status: DC

## 2023-04-19 NOTE — Telephone Encounter (Signed)
PT. Is requesting a refill on levothyroxine (SYNTHROID) 175 MCG table .

## 2023-04-19 NOTE — Telephone Encounter (Signed)
Called pt w/[employer] interpreter # [phone] and she did not answer. A message was left stating that her requested prescription has been sent to her pharmacy.

## 2023-06-23 ENCOUNTER — Ambulatory Visit: Payer: Self-pay | Admitting: Nurse Practitioner

## 2023-08-19 ENCOUNTER — Ambulatory Visit: Payer: 59 | Admitting: Nurse Practitioner

## 2023-08-19 DIAGNOSIS — E039 Hypothyroidism, unspecified: Secondary | ICD-10-CM

## 2023-08-19 NOTE — Progress Notes (Deleted)
Endocrinology Consult Note                                         08/19/2023, 7:13 AM  Subjective:   Subjective    Brittney Pena is a 47 y.o.-year-old female patient being seen in consultation for hypothyroidism referred by Georgina Quint, MD.   Past Medical History:  Diagnosis Date   Hypothyroidism    Thyroid disease     Past Surgical History:  Procedure Laterality Date   BREAST BIOPSY Right    needle core biopsy, benign   CESAREAN SECTION     x1   DIAGNOSTIC LAPAROSCOPY WITH REMOVAL OF ECTOPIC PREGNANCY N/A 02/15/2019   Procedure: DIAGNOSTIC LAPAROSCOPY WITH REMOVAL OF RUPTURED ECTOPIC PREGNANCY AND EVACUATION OF HEMOPERITONEUM;  Surgeon: Conan Bowens, MD;  Location: [facility] OR;  Service: Gynecology;  Laterality: N/A;   LAPAROSCOPIC LYSIS OF ADHESIONS N/A 02/15/2019   Procedure: Laparoscopic Lysis Of Adhesions;  Surgeon: Conan Bowens, MD;  Location: [facility] OR;  Service: Gynecology;  Laterality: N/A;   LAPAROSCOPIC UNILATERAL SALPINGECTOMY Left 02/15/2019   Procedure: Laparoscopic Unilateral Salpingectomy;  Surgeon: Conan Bowens, MD;  Location: [facility] OR;  Service: Gynecology;  Laterality: Left;    Social History   Socioeconomic History   Marital status: Single    Spouse name: Not on file   Number of children: 2   Years of education: Not on file   Highest education level: Not on file  Occupational History   Not on file  Tobacco Use   Smoking status: Former    Types: Cigarettes   Smokeless tobacco: Never   Tobacco comments:    refused to answer question   Vaping Use   Vaping status: Never Used  Substance and Sexual Activity   Alcohol use: Yes    Alcohol/week: 0.0 standard drinks of alcohol    Comment: Social   Drug use: No   Sexual activity: Yes    Partners: Male    Birth control/protection: Condom    Comment: pregnant-ectopic preg  Other Topics Concern   Not on file  Social History Narrative    Pt is from [place]. She has lived in [place] for 10 years. Her 2 children live with her.    Social Determinants of Health   Financial Resource Strain: Not on file  Food Insecurity: Food Insecurity Present (02/17/2023)   Hunger Vital Sign    Worried About Running Out of Food in the Last Year: Sometimes true    Ran Out of Food in the Last Year: Sometimes true  Transportation Needs: No Transportation Needs (02/17/2023)   PRAPARE - Administrator, Civil Service (Medical): No    Lack of Transportation (Non-Medical): No  Physical Activity: Sufficiently Active (03/03/2018)   Exercise Vital Sign    Days of Exercise per Week: 5 days    Minutes of Exercise per Session: 60 min  Stress: No Stress Concern Present (03/03/2018)   [employer] of Occupational Health - Occupational Stress Questionnaire  Feeling of Stress : Only a little  Social Connections: Moderately Isolated (03/03/2018)   Social Connection and Isolation Panel [NHANES]    Frequency of Communication with Friends and Family: More than three times a week    Frequency of Social Gatherings with Friends and Family: More than three times a week    Attends Religious Services: Never    Database administrator or Organizations: No    Attends Engineer, structural: Never    Marital Status: Divorced    Family History  Problem Relation Age of Onset   Colon cancer Father    Breast cancer Maternal Aunt 45   Stomach cancer Neg Hx    Rectal cancer Neg Hx    Esophageal cancer Neg Hx     Outpatient Encounter Medications as of 08/19/2023  Medication Sig   GLUCOSAMINE-CHONDROITIN PO Take by mouth daily. (Patient not taking: Reported on 12/07/2022)   Green Tea, Camellia sinensis, (GREEN TEA PO) Take by mouth daily. (Patient not taking: Reported on 02/17/2023)   levothyroxine (SYNTHROID) 175 MCG tablet Take 1 tablet (175 mcg total) by mouth daily.   No facility-administered encounter medications on file as of 08/19/2023.     ALLERGIES: No Known Allergies VACCINATION STATUS: Immunization History  Administered Date(s) Administered   Tdap 03/03/2018     HPI   Brittney Pena  is a patient with the above medical history. she was diagnosed with hypothyroidism at approximate age of *** years, which required subsequent initiation of thyroid hormone replacement therapy. she was given various doses of Levothyroxine over the years, currently on 175 micrograms. she reports compliance to this medication:  Taking it daily on empty stomach with water, separated by >30 minutes before breakfast and other medications, and by at least 4 hours from calcium, iron, PPIs, multivitamins .  I reviewed patient's thyroid tests:  Lab Results  Component Value Date   TSH 21.300 (H) 12/07/2022   TSH 17.65 (H) 11/03/2021   TSH 37.32 (H) 09/24/2021   TSH 0.43 03/25/2021   TSH 13.700 (H) 02/07/2020   TSH 1.840 03/03/2018   TSH 188.188 (H) 07/25/2015   TSH 79.910 (H) 10/04/2009   TSH 508.509 (H) 12/27/2008   TSH 399.311 (H) 11/06/2008   FREET4 1.04 12/07/2022   FREET4 0.48 (L) 10/04/2009   FREET4 0.30 (L) 11/06/2008   FREET4 0.28 (L) 09/06/2008   FREET4 0.29 (L) 03/12/2008   FREET4 0.68 (L) 11/14/2007     Pt describes: - weight gain - fatigue - cold intolerance - depression - constipation - dry skin - hair loss  Pt denies feeling nodules in neck, hoarseness, dysphagia/odynophagia, SOB with lying down.  she *** family history of thyroid disorders in her ***.  No family history of thyroid cancer.  No history of radiation therapy to head or neck.  No recent use of iodine supplements.  Denies use of Biotin containing supplements.    ROS:  Constitutional: no weight gain/loss, no fatigue, no subjective hyperthermia, no subjective hypothermia Eyes: no blurry vision, no xerophthalmia ENT: no sore throat, no nodules palpated in throat, no dysphagia/odynophagia, no hoarseness Cardiovascular: no chest pain, no SOB, no  palpitations, no leg swelling Respiratory: no cough, no SOB Gastrointestinal: no nausea/vomiting/diarrhea Musculoskeletal: no muscle/joint aches Skin: no rashes Neurological: no tremors, no numbness, no tingling, no dizziness Psychiatric: no depression, no anxiety   Objective:   Objective     There were no vitals taken for this visit. Wt Readings from Last 3 Encounters:  02/17/23 141  lb 14.4 oz (64.4 kg)  12/07/22 141 lb 8 oz (64.2 kg)  10/05/22 141 lb 14.4 oz (64.4 kg)    BP Readings from Last 3 Encounters:  02/17/23 98/69  12/07/22 (!) 92/57  10/05/22 118/73     Constitutional:  There is no height or weight on file to calculate BMI., not in acute distress, normal state of mind Eyes: PERRLA, EOMI, no exophthalmos ENT: moist mucous membranes, no thyromegaly, no cervical lymphadenopathy Cardiovascular: normal precordial activity, RRR, no murmur/rubs/gallops Respiratory:  adequate breathing efforts, no gross chest deformity, Clear to auscultation bilaterally Gastrointestinal: abdomen soft, non-tender, no distension, bowel sounds present Musculoskeletal: no gross deformities, strength intact in all four extremities Skin: moist, warm, no rashes Neurological: no tremor with outstretched hands, deep tendon reflexes normal in BLE.   CMP ( most recent) CMP     Component Value Date/Time   NA 138 09/24/2021 1631   NA 143 02/07/2020 1606   K 4.0 09/24/2021 1631   CL 101 09/24/2021 1631   CO2 32 09/24/2021 1631   GLUCOSE 80 09/24/2021 1631   BUN 14 09/24/2021 1631   BUN 14 02/07/2020 1606   CREATININE 0.74 09/24/2021 1631   CALCIUM 9.8 09/24/2021 1631   PROT 7.7 09/24/2021 1631   PROT 7.3 02/07/2020 1606   ALBUMIN 4.5 09/24/2021 1631   ALBUMIN 4.6 02/07/2020 1606   AST 19 09/24/2021 1631   ALT 15 09/24/2021 1631   ALKPHOS 51 09/24/2021 1631   BILITOT 0.4 09/24/2021 1631   BILITOT 0.2 02/07/2020 1606   GFR 97.80 09/24/2021 1631   GFRNONAA >60 05/13/2020 1610      Diabetic Labs (most recent): Lab Results  Component Value Date   HGBA1C 5.7 03/25/2021     Lipid Panel ( most recent) Lipid Panel     Component Value Date/Time   CHOL 140 03/03/2018 1712   TRIG 45 03/03/2018 1712   HDL 72 03/03/2018 1712   CHOLHDL 1.9 03/03/2018 1712   CHOLHDL 2.7 Ratio 10/04/2009 1912   VLDL 8 10/04/2009 1912   LDLCALC 59 03/03/2018 1712   LABVLDL 9 03/03/2018 1712       Lab Results  Component Value Date   TSH 21.300 (H) 12/07/2022   TSH 17.65 (H) 11/03/2021   TSH 37.32 (H) 09/24/2021   TSH 0.43 03/25/2021   TSH 13.700 (H) 02/07/2020   TSH 1.840 03/03/2018   TSH 188.188 (H) 07/25/2015   TSH 79.910 (H) 10/04/2009   TSH 508.509 (H) 12/27/2008   TSH 399.311 (H) 11/06/2008   FREET4 1.04 12/07/2022   FREET4 0.48 (L) 10/04/2009   FREET4 0.30 (L) 11/06/2008   FREET4 0.28 (L) 09/06/2008   FREET4 0.29 (L) 03/12/2008   FREET4 0.68 (L) 11/14/2007      Assessment & Plan:   ASSESSMENT / PLAN:  1. Hypothyroidism   Patient with long-standing hypothyroidism, on levothyroxine therapy. On physical exam, patient does not have gross goiter, thyroid nodules, or neck compression symptoms.  -Her weight based total replacement dose should be around 103 mcg.  She is on substantially higher dose of Levothyroxine at 175 mcg.  Suspect absorption issue.  - We discussed about correct intake of levothyroxine, at fasting, with water, separated by at least 30 minutes from breakfast, and separated by more than 4 hours from calcium, iron, multivitamins, acid reflux medications (PPIs). -Patient is made aware of the fact that thyroid hormone replacement is needed for life, dose to be adjusted by periodic monitoring of thyroid function tests.  - Will  check thyroid tests before next visit: TSH, free T4, and antibody testing to help classify her dysfunction.  -Due to absence of clinical goiter, no need for thyroid ultrasound.  - Time spent with the patient: 45 minutes,  of which >50% was spent in obtaining information about her symptoms, reviewing her previous labs, evaluations, and treatments, counseling her about her hypothyroidism, and developing a plan to confirm the diagnosis and long term treatment as necessary. Please refer to "Patient Self Inventory" in the Media tab for reviewed elements of pertinent patient history.  [place] participated in the discussions, expressed understanding, and voiced agreement with the above plans.  All questions were answered to her satisfaction. she is encouraged to contact clinic should she have any questions or concerns prior to her return visit.   FOLLOW UP PLAN:  No follow-ups on file.  Ronny Bacon, [facility] [address] [place], [place] [address] Phone: [phone] Fax: [phone]  08/19/2023, 7:13 AM

## 2023-09-01 ENCOUNTER — Ambulatory Visit: Payer: 59 | Admitting: Family Medicine

## 2023-09-01 ENCOUNTER — Encounter: Payer: Self-pay | Admitting: Family Medicine

## 2023-09-01 VITALS — BP 118/66 | HR 72 | Wt 144.5 lb

## 2023-09-01 DIAGNOSIS — W448XXA Other foreign body entering into or through a natural orifice, initial encounter: Secondary | ICD-10-CM

## 2023-09-01 DIAGNOSIS — T192XXA Foreign body in vulva and vagina, initial encounter: Secondary | ICD-10-CM | POA: Diagnosis not present

## 2023-09-01 DIAGNOSIS — E039 Hypothyroidism, unspecified: Secondary | ICD-10-CM

## 2023-09-01 MED ORDER — LEVOTHYROXINE SODIUM 175 MCG PO TABS: 175.0000 ug | ORAL_TABLET | Freq: Every day | ORAL | 1 refills | Status: DC

## 2023-09-01 NOTE — Progress Notes (Signed)
   GYNECOLOGY PROBLEM  VISIT ENCOUNTER NOTE  Subjective:   Brittney Pena is a 47 y.o. [phone] female here for a problem GYN visit.  Current complaints: Tampon in place, last period 2 days ago.   Denies abnormal vaginal bleeding, discharge, pelvic pain, problems with intercourse or other gynecologic concerns.    Gynecologic History Patient's last menstrual period was 08/30/2023.  Contraception: none  Health Maintenance Due  Topic Date Due   INFLUENZA VACCINE  Never done   COVID-19 Vaccine (1 - 2023-24 season) Never done    The following portions of the patient's history were reviewed and updated as appropriate: allergies, current medications, past family history, past medical history, past social history, past surgical history and problem list.  Review of Systems Pertinent items are noted in HPI.   Objective:  BP 118/66   Pulse 72   Wt 144 lb 8 oz (65.5 kg)   LMP 08/30/2023   BMI 23.32 kg/m  Gen: well appearing, NAD HEENT: no scleral icterus CV: RR Lung: Normal WOB Ext: warm well perfused  PELVIC: Normal appearing external genitalia; Speculum placed and normal appearing vaginal mucosa and cervix.  No abnormal discharge noted. I was unable to see a tampon in the vaginal vault. I also palpated 360 degrees around the cervix to try to feel for the retained tampon twice. I performed a bimanual without tenderness and was able to palpate the tampon. I  Normal uterine size, no other palpable masses, no uterine or adnexal tenderness.   Assessment and Plan:  1. Hypothyroidism, unspecified type - levothyroxine (SYNTHROID) 175 MCG tablet; Take 1 tablet (175 mcg total) by mouth daily.  Dispense: 60 tablet; Refill: 1  2. Retained tampon, initial encounter I was unable to find the retained tampon. I was able to get a full exam of the vagina and unable to palpated any retained object.  Patient does not remember removing but never uses tampons typically and was unsure of how they work.   Discussed it might have been pushed out with valsalva  Reviewed return precautions.   Please refer to After Visit Summary for other counseling recommendations.   No follow-ups on file.  Future Appointments  Date Time Provider Department Center  11/24/2023  9:00 AM Dani Gobble, NP [facility] None     Federico Flake, MD, MPH, ABFM Attending Physician Faculty Practice- Center for [facility]

## 2023-11-24 ENCOUNTER — Ambulatory Visit: Payer: 59 | Admitting: Nurse Practitioner

## 2023-11-24 NOTE — Patient Instructions (Incomplete)

## 2024-02-15 ENCOUNTER — Ambulatory Visit: Payer: Self-pay

## 2024-02-15 NOTE — Telephone Encounter (Signed)
 Spanish ID interpretor: [id number]

## 2024-02-15 NOTE — Telephone Encounter (Signed)
   Disposition:[x]  Follow-up with PCP  Additional Notes: Pt states she would like to come into the office to get blood work done for a pregnancy test. This RN will route to office. Pt call back number confirmed as [phone].  Reason for Disposition  Requesting regular office appointment  Answer Assessment - Initial Assessment Questions 1. REASON FOR CALL or QUESTION: "What is your reason for calling today?" or "How can I best help you?" or "What question do you have that I can help answer?"     A pregnancy test  Protocols used: Information Only Call - No Triage-A-AH

## 2024-04-13 ENCOUNTER — Other Ambulatory Visit: Payer: Self-pay | Admitting: Obstetrics and Gynecology

## 2024-04-13 DIAGNOSIS — E039 Hypothyroidism, unspecified: Secondary | ICD-10-CM

## 2024-05-17 ENCOUNTER — Encounter: Payer: Self-pay | Admitting: Family Medicine

## 2024-06-07 ENCOUNTER — Other Ambulatory Visit: Payer: Self-pay | Admitting: Obstetrics and Gynecology

## 2024-06-07 DIAGNOSIS — E039 Hypothyroidism, unspecified: Secondary | ICD-10-CM

## 2024-08-02 ENCOUNTER — Other Ambulatory Visit: Payer: Self-pay | Admitting: Emergency Medicine

## 2024-08-02 DIAGNOSIS — E039 Hypothyroidism, unspecified: Secondary | ICD-10-CM

## 2024-08-14 ENCOUNTER — Ambulatory Visit (INDEPENDENT_AMBULATORY_CARE_PROVIDER_SITE_OTHER): Payer: Self-pay | Admitting: Family Medicine

## 2024-08-14 ENCOUNTER — Encounter: Payer: Self-pay | Admitting: Family Medicine

## 2024-08-14 ENCOUNTER — Other Ambulatory Visit (HOSPITAL_COMMUNITY)
Admission: RE | Admit: 2024-08-14 | Discharge: 2024-08-14 | Disposition: A | Discharge status: Home / Self Care | Source: Ambulatory Visit | Attending: Family Medicine | Admitting: Family Medicine

## 2024-08-14 ENCOUNTER — Ambulatory Visit: Payer: Self-pay | Admitting: Family Medicine

## 2024-08-14 VITALS — BP 108/70 | HR 82 | Temp 98.5°F | Ht 66.0 in | Wt 144.0 lb

## 2024-08-14 DIAGNOSIS — R10A3 Flank pain, bilateral: Secondary | ICD-10-CM | POA: Diagnosis not present

## 2024-08-14 DIAGNOSIS — Z79899 Other long term (current) drug therapy: Secondary | ICD-10-CM

## 2024-08-14 DIAGNOSIS — R110 Nausea: Secondary | ICD-10-CM

## 2024-08-14 DIAGNOSIS — N941 Unspecified dyspareunia: Secondary | ICD-10-CM

## 2024-08-14 DIAGNOSIS — R102 Pelvic and perineal pain unspecified side: Secondary | ICD-10-CM | POA: Diagnosis not present

## 2024-08-14 DIAGNOSIS — N926 Irregular menstruation, unspecified: Secondary | ICD-10-CM

## 2024-08-14 DIAGNOSIS — E039 Hypothyroidism, unspecified: Secondary | ICD-10-CM | POA: Diagnosis not present

## 2024-08-14 DIAGNOSIS — Z124 Encounter for screening for malignant neoplasm of cervix: Secondary | ICD-10-CM

## 2024-08-14 DIAGNOSIS — Z113 Encounter for screening for infections with a predominantly sexual mode of transmission: Secondary | ICD-10-CM

## 2024-08-14 LAB — COMPREHENSIVE METABOLIC PANEL WITH GFR
ALT: 18 U/L (ref 0–35)
AST: 18 U/L (ref 0–37)
Albumin: 4.6 g/dL (ref 3.5–5.2)
Alkaline Phosphatase: 45 U/L (ref 39–117)
BUN: 17 mg/dL (ref 6–23)
CO2: 29 meq/L (ref 19–32)
Calcium: 9.6 mg/dL (ref 8.4–10.5)
Chloride: 104 meq/L (ref 96–112)
Creatinine, Ser: 0.8 mg/dL (ref 0.40–1.20)
GFR: 87.28 mL/min (ref >60.00)
Glucose, Bld: 62 mg/dL — ABNORMAL LOW (ref 70–99)
Potassium: 3.8 meq/L (ref 3.5–5.1)
Sodium: 141 meq/L (ref 135–145)
Total Bilirubin: 0.5 mg/dL (ref 0.2–1.2)
Total Protein: 7.6 g/dL (ref 6.0–8.3)

## 2024-08-14 LAB — POCT URINALYSIS DIP (CLINITEK)
Bilirubin, UA: NEGATIVE
Glucose, UA: NEGATIVE mg/dL
Ketones, POC UA: NEGATIVE mg/dL
Leukocytes, UA: NEGATIVE
Nitrite, UA: NEGATIVE
POC PROTEIN,UA: NEGATIVE
Spec Grav, UA: <=1.005 — AB (ref 1.010–1.025)
Urobilinogen, UA: 0.2 U/dL
pH, UA: 6 (ref 5.0–8.0)

## 2024-08-14 LAB — CBC WITH DIFFERENTIAL/PLATELET
Basophils Absolute: 0 K/uL (ref 0.0–0.1)
Basophils Relative: 0.9 % (ref 0.0–3.0)
Eosinophils Absolute: 0 K/uL (ref 0.0–0.7)
Eosinophils Relative: 1.1 % (ref 0.0–5.0)
HCT: 41.4 % (ref 36.0–46.0)
Hemoglobin: 13.7 g/dL (ref 12.0–15.0)
Lymphocytes Relative: 32.9 % (ref 12.0–46.0)
Lymphs Abs: 1.4 K/uL (ref 0.7–4.0)
MCHC: 33.1 g/dL (ref 30.0–36.0)
MCV: 90.8 fl (ref 78.0–100.0)
Monocytes Absolute: 0.3 K/uL (ref 0.1–1.0)
Monocytes Relative: 7.4 % (ref 3.0–12.0)
Neutro Abs: 2.5 K/uL (ref 1.4–7.7)
Neutrophils Relative %: 57.7 % (ref 43.0–77.0)
Platelets: 284 K/uL (ref 150.0–400.0)
RBC: 4.56 Mil/uL (ref 3.87–5.11)
RDW: 13.9 % (ref 11.5–15.5)
WBC: 4.4 K/uL (ref 4.0–10.5)

## 2024-08-14 LAB — VITAMIN D 25 HYDROXY (VIT D DEFICIENCY, FRACTURES): VITD: 41.33 ng/mL (ref 30.00–100.00)

## 2024-08-14 LAB — POCT URINE PREGNANCY: Preg Test, Ur: NEGATIVE

## 2024-08-14 LAB — VITAMIN B12: Vitamin B-12: >1500 pg/mL — ABNORMAL HIGH (ref 211–911)

## 2024-08-14 LAB — TSH: TSH: 52.65 u[IU]/mL — ABNORMAL HIGH (ref 0.35–5.50)

## 2024-08-14 MED ORDER — LEVOTHYROXINE SODIUM 175 MCG PO TABS: 175.0000 ug | ORAL_TABLET | Freq: Every day | ORAL | 0 refills | Status: DC

## 2024-08-14 MED ORDER — LEVOTHYROXINE SODIUM 200 MCG PO TABS: 200.0000 ug | ORAL_TABLET | Freq: Every day | ORAL | 3 refills | Status: AC

## 2024-08-14 NOTE — Patient Instructions (Addendum)
 We are checking labs today, will be in contact with any results that require further attention  I have ordered a pelvic ultrasound. Someone will be calling to get you scheduled.  Refilled levothyroxine .  Follow-up with me for new or worsening symptoms.

## 2024-08-14 NOTE — Progress Notes (Signed)
 Acute Office Visit  Subjective:     Patient ID: Brittney Pena, female    DOB: [birth year], 48 y.o.   MRN: [id number]  No chief complaint on file.   HPI  Discussed the use of AI scribe software for clinical note transcription with the patient, who gave verbal consent to proceed.  History of Present Illness Brittney Pena is a 48 year old female who presents with abdominal pain and nausea. In person Spanish interpreter with this visit.  Abdominal pain - Cramping pain present for almost three months - Pain quality similar to menstrual cramps - Persistent in nature - Onset prior to menopause  Nausea - Occasional nausea, particularly in the mornings before eating - Duration of approximately two months - No associated vomiting, chills, or fever  Dyspareunia - Painful intercourse  Gynecologic symptoms - No late periods - No vaginal discharge  Sexually transmitted infection concerns - Requests STD testing due to concerns related to her husband  Thyroid  function concerns - Uncertain of last thyroid  level check - Requests thyroid  testing     ROS Per HPI      Objective:    BP 108/70 (Cuff Size: Normal)   Pulse 82   Temp 98.5 F (36.9 C) (Temporal)   Ht 5' 6 (1.676 m)   Wt 144 lb (65.3 kg)   SpO2 98%   BMI 23.24 kg/m    Physical Exam Vitals and nursing note reviewed.  Constitutional:      General: She is not in acute distress.    Appearance: Normal appearance. She is normal weight.  HENT:     Head: Normocephalic and atraumatic.     Right Ear: External ear normal.     Left Ear: External ear normal.     Nose: Nose normal.     Mouth/Throat:     Mouth: Mucous membranes are moist.     Pharynx: Oropharynx is clear.  Eyes:     Extraocular Movements: Extraocular movements intact.     Pupils: Pupils are equal, round, and reactive to light.  Cardiovascular:     Rate and Rhythm: Normal rate and regular rhythm.     Pulses: Normal pulses.     Heart sounds:  Normal heart sounds.  Pulmonary:     Effort: Pulmonary effort is normal. No respiratory distress.     Breath sounds: Normal breath sounds. No wheezing, rhonchi or rales.  Abdominal:     General: There is no distension.     Palpations: There is no mass.     Tenderness: There is abdominal tenderness (pelvic). There is no guarding or rebound.     Hernia: No hernia is present.  Musculoskeletal:        General: Normal range of motion.     Cervical back: Normal range of motion.     Right lower leg: No edema.     Left lower leg: No edema.  Lymphadenopathy:     Cervical: No cervical adenopathy.  Neurological:     General: No focal deficit present.     Mental Status: She is alert and oriented to person, place, and time.  Psychiatric:        Mood and Affect: Mood normal.        Thought Content: Thought content normal.     Results for orders placed or performed in visit on 08/14/24  POCT URINALYSIS DIP (CLINITEK)  Result Value Ref Range   Color, UA yellow yellow   Clarity, UA clear clear   Glucose,  UA negative negative mg/dL   Bilirubin, UA negative negative   Ketones, POC UA negative negative mg/dL   Spec Grav, UA <=8.994 (A) 1.010 - 1.025   Blood, UA small (A) negative   pH, UA 6.0 5.0 - 8.0   POC PROTEIN,UA negative negative, trace   Urobilinogen, UA 0.2 0.2 or 1.0 E.U./dL   Nitrite, UA Negative Negative   Leukocytes, UA Negative Negative  POCT urine pregnancy  Result Value Ref Range   Preg Test, Ur Negative Negative        Assessment & Plan:   Assessment and Plan Assessment & Plan Pelvic pain, nausea, dyspareunia, Cervical cancer screening Chronic pelvic pain for three months with morning nausea and dyspareunia. Negative pregnancy test. Differential includes infection or gynecological issues. Concern about STDs due to painful intercourse. - Order vaginal swab for gonorrhea, chlamydia, trichomoniasis, bacterial vaginosis, and yeast infection. - Perform blood tests for  hepatitis C, syphilis, and HIV. - Order pelvic ultrasound if pain persists before gynecology appointment. - Refer to gynecology for further evaluation and management. - UA shows blood, will culture  Missed Period - POC Hcg negative - labs ordered  Hypothyroidism Thyroid  levels not recently checked. - Order thyroid  function tests. - Perform regular blood work including CBC, metabolic panel, vitamin B12, and vitamin D levels.  Medication Management - Checking labs today, will dose adjust medications as needed     Orders Placed This Encounter  Procedures   Urine Culture    Standing Status:   Future    Number of Occurrences:   1    Expected Date:   08/14/2024    Expiration Date:   08/14/2025   US  Pelvis Complete    Standing Status:   Future    Expiration Date:   08/14/2025    Reason for Exam (SYMPTOM  OR DIAGNOSIS REQUIRED):   pelvic pain    Preferred imaging location?:   [facility]   CBC with Differential/Platelet    Release to patient:   Immediate [1]   TSH   VITAMIN D 25 Hydroxy (Vit-D Deficiency, Fractures)   Vitamin B12   Comprehensive metabolic panel with GFR    Release to patient:   Immediate [1]   HIV antibody (with reflex)   RPR   Hepatitis C Antibody   Ambulatory referral to Obstetrics / Gynecology    Referral Priority:   Routine    Referral Type:   Consultation    Referral Reason:   Specialty Services Required    Requested Specialty:   Obstetrics and Gynecology    Number of Visits Requested:   1   POCT URINALYSIS DIP (CLINITEK)   POCT urine pregnancy     Meds ordered this encounter  Medications   levothyroxine  (SYNTHROID ) 175 MCG tablet    Sig: Take 1 tablet (175 mcg total) by mouth daily.    Dispense:  60 tablet    Refill:  0    Return if symptoms worsen or fail to improve.  Corean LITTIE Ku, FNP

## 2024-08-15 LAB — URINE CULTURE: Result:: NO GROWTH

## 2024-08-15 LAB — HEPATITIS C ANTIBODY: Hepatitis C Ab: NONREACTIVE

## 2024-08-15 LAB — HIV ANTIBODY (ROUTINE TESTING W REFLEX)
HIV 1&2 Ab, 4th Generation: NONREACTIVE
HIV FINAL INTERPRETATION: NEGATIVE

## 2024-08-15 LAB — RPR: RPR Ser Ql: NONREACTIVE

## 2024-08-16 LAB — CERVICOVAGINAL ANCILLARY ONLY
Comment: NEGATIVE
Comment: NEGATIVE
Comment: NEGATIVE
Comment: NEGATIVE
Comment: NEGATIVE
Comment: NORMAL

## 2024-08-17 NOTE — Telephone Encounter (Signed)
 Copied from CRM #[id number]. Topic: Referral - Question >> Aug 17, 2024 12:11 PM Brittney Pena wrote: Reason for CRM: Patient called in stating that where she was given a order for ultrasound is not covered by her insurance Photographer). Patient is requesting a new order be placed somewhere in [place] that is covered by her insurance. Patient is requesting a call back and can be reached at [phone].

## 2024-08-18 NOTE — Telephone Encounter (Signed)
 Copied from CRM [id number]. Topic: Clinical - Lab/Test Results >> Aug 18, 2024  2:25 PM Anairis L wrote: Reason for CRM: patient is returning call regarding results.Please call patient back. interpreter ID #[id number]

## 2024-08-22 ENCOUNTER — Other Ambulatory Visit: Payer: Self-pay

## 2024-08-22 DIAGNOSIS — E039 Hypothyroidism, unspecified: Secondary | ICD-10-CM

## 2024-08-22 DIAGNOSIS — Z79899 Other long term (current) drug therapy: Secondary | ICD-10-CM

## 2024-08-23 ENCOUNTER — Ambulatory Visit: Payer: Self-pay | Admitting: *Deleted

## 2024-08-23 NOTE — Telephone Encounter (Signed)
 FYI Only or Action Required?: Action required by provider: referral request.  Patient was last seen in primary care on 08/14/2024 by Alvia Corean CROME, FNP.  Called Nurse Triage reporting Hematuria.  Symptoms began several weeks ago.  Interventions attempted: Nothing.  Symptoms are: gradually worsening.  Triage Disposition: See Physician Within 24 Hours  Patient/caregiver understands and will follow disposition?: Yes  Patient is calling to report the OB/GYN referral did not accept her insurance- she will need a referral to another office. Patient states she is still having urinary pain and blood only with urination- none on underwear or clothing. Appointment scheduled for follow up.  Copied from CRM #[id number]. Topic: Clinical - Red Word Triage >> Aug 23, 2024  3:50 PM Ashley R wrote: Red Word that prompted transfer to Nurse Triage: blood in urine   ----------------------------------------------------------------------- From previous Reason for Contact - Referral Status: Reason for CRM: Status of referral [id number] Reason for Disposition  Pain or burning with passing urine  Answer Assessment - Initial Assessment Questions 1. COLOR of URINE: Describe the color of the urine.  (e.g., tea-colored, pink, red, bloody) Do you have blood clots in your urine? (e.g., none, pea, grape, small coin)     Not a lot- red 2. ONSET: When did the bleeding start?      2 weeks 3. EPISODES: How many times has there been blood in the urine? or How many times today?     Every time with urination 4. PAIN with URINATION: Is there any pain with passing your urine? If Yes, ask: How bad is the pain?  (Scale 1-10; or mild, moderate, severe)     Yes- almost all the time 5. FEVER: Do you have a fever? If Yes, ask: What is your temperature, how was it measured, and when did it start?     no 6. ASSOCIATED SYMPTOMS: Are you passing urine more frequently than usual?    yes 7. OTHER  SYMPTOMS: Do you have any other symptoms? (e.g., back/flank pain, abdomen pain, vomiting)      Pelvic pain, itching 8. PREGNANCY: Is there any chance you are pregnant? When was your last menstrual period?     Negative pregnancy test  Protocols used: Urine - Blood In-A-AH

## 2024-08-24 ENCOUNTER — Encounter: Payer: Self-pay | Admitting: Family Medicine

## 2024-08-24 ENCOUNTER — Ambulatory Visit (INDEPENDENT_AMBULATORY_CARE_PROVIDER_SITE_OTHER): Admitting: Family Medicine

## 2024-08-24 VITALS — BP 108/70 | HR 79 | Temp 98.2°F | Ht 66.0 in | Wt 143.4 lb

## 2024-08-24 DIAGNOSIS — R1022 Pelvic and perineal pain left side: Secondary | ICD-10-CM

## 2024-08-24 DIAGNOSIS — R319 Hematuria, unspecified: Secondary | ICD-10-CM | POA: Diagnosis not present

## 2024-08-24 LAB — URINALYSIS, ROUTINE W REFLEX MICROSCOPIC
Bilirubin Urine: NEGATIVE
Ketones, ur: NEGATIVE
Leukocytes,Ua: NEGATIVE
Nitrite: NEGATIVE
Specific Gravity, Urine: 1.015 (ref 1.000–1.030)
Total Protein, Urine: NEGATIVE
Urine Glucose: NEGATIVE
Urobilinogen, UA: 0.2 (ref 0.0–1.0)
pH: 6 (ref 5.0–8.0)

## 2024-08-24 LAB — POCT URINALYSIS DIP (CLINITEK)
Bilirubin, UA: NEGATIVE
Glucose, UA: 250 mg/dL — AB
Ketones, POC UA: NEGATIVE mg/dL
Leukocytes, UA: NEGATIVE
Nitrite, UA: NEGATIVE
POC PROTEIN,UA: NEGATIVE
Spec Grav, UA: 1.015 (ref 1.010–1.025)
Urobilinogen, UA: 0.2 U/dL
pH, UA: 6 (ref 5.0–8.0)

## 2024-08-24 NOTE — Progress Notes (Signed)
 Acute Office Visit  Subjective:     Patient ID: Brittney Pena, female    DOB: 06/09/1976, 48 y.o.   MRN: [id number]  Chief Complaint  Patient presents with   Acute Visit    HPI  48 year old female presents for evaluation of persistent lower left quadrant abdominal pain and left pelvic pain. In person Spanish interpreter, Melody Dhers present and interpreting for this visit. Reports intermittent episodes of gross hematuria for the last 10 to 15 days. She was seen in this office 10 days ago.  Pelvic ultrasound was ordered, imaging center did not approve her insurance. Referral to gynecology was sent 10 days ago as well.  States she does not have an appointment with them yet. Denies severe pain, nausea, vomiting, diarrhea, rash, fever, chills, other symptoms.   ROS Per HPI      Objective:    BP 108/70   Pulse 79   Temp 98.2 F (36.8 C)   Ht 5' 6 (1.676 m)   Wt 143 lb 6.4 oz (65 kg)   SpO2 97%   BMI 23.15 kg/m    Physical Exam Vitals and nursing note reviewed. Exam conducted with a chaperone present Epifania Olden, CMA).  Constitutional:      General: She is not in acute distress.    Appearance: Normal appearance. She is normal weight.  HENT:     Head: Normocephalic and atraumatic.     Right Ear: External ear normal.     Left Ear: External ear normal.     Nose: Nose normal.     Mouth/Throat:     Mouth: Mucous membranes are moist.     Pharynx: Oropharynx is clear.  Eyes:     Extraocular Movements: Extraocular movements intact.     Pupils: Pupils are equal, round, and reactive to light.  Cardiovascular:     Rate and Rhythm: Normal rate and regular rhythm.     Pulses: Normal pulses.  Pulmonary:     Effort: Pulmonary effort is normal.  Abdominal:     General: There is no distension.     Palpations: There is no mass.     Tenderness: There is abdominal tenderness. There is no guarding or rebound.     Hernia: No hernia is present.     Comments: LLQ/L pelvic  tenderness  Genitourinary:    General: Normal vulva.  Musculoskeletal:        General: Normal range of motion.     Cervical back: Normal range of motion.     Right lower leg: No edema.     Left lower leg: No edema.  Lymphadenopathy:     Cervical: No cervical adenopathy.  Neurological:     General: No focal deficit present.     Mental Status: She is alert and oriented to person, place, and time.  Psychiatric:        Mood and Affect: Mood normal.        Thought Content: Thought content normal.     Results for orders placed or performed in visit on 08/24/24  POCT URINALYSIS DIP (CLINITEK)  Result Value Ref Range   Color, UA yellow yellow   Clarity, UA clear clear   Glucose, UA =250 (A) negative mg/dL   Bilirubin, UA negative negative   Ketones, POC UA negative negative mg/dL   Spec Grav, UA 8.984 8.989 - 1.025   Blood, UA small (A) negative   pH, UA 6.0 5.0 - 8.0   POC PROTEIN,UA negative negative, trace  Urobilinogen, UA 0.2 0.2 or 1.0 E.U./dL   Nitrite, UA Negative Negative   Leukocytes, UA Negative Negative        Assessment & Plan:   Left-sided pelvic pain -     US  PELVIC COMPLETE WITH TRANSVAGINAL; Future  Hematuria, unspecified type -     POCT URINALYSIS DIP (CLINITEK) -     Urine Culture; Future -     Urinalysis, Routine w reflex microscopic -     US  PELVIC COMPLETE WITH TRANSVAGINAL; Future       Orders Placed This Encounter  Procedures   Urine Culture    Standing Status:   Future    Number of Occurrences:   1    Expected Date:   08/24/2024    Expiration Date:   08/24/2025   US  PELVIC COMPLETE WITH TRANSVAGINAL    Standing Status:   Future    Expiration Date:   08/24/2025    Reason for Exam (SYMPTOM  OR DIAGNOSIS REQUIRED):   L pelvic pain, hx ovarian cyst    Preferred imaging location?:   [facility]   Urinalysis, Routine w reflex microscopic   POCT URINALYSIS DIP (CLINITEK)     No orders of the defined types were placed in this  encounter.   Return if symptoms worsen or fail to improve.  Corean LITTIE Ku, FNP

## 2024-08-24 NOTE — Patient Instructions (Addendum)
 Ambulatory referral to Obstetrics / Gynecology Dnde: [facility] at [facility] [place]: [address] [place] [address] [place]: [phone] Vence: 08/14/2025 (pedido)  I have ordered pelvic ultrasound at [facility]. They should be calling soon to get you scheduled.   Follow-up with me for new or worsening symptoms.

## 2024-08-25 LAB — URINE CULTURE: Result:: NO GROWTH

## 2024-08-27 ENCOUNTER — Ambulatory Visit: Payer: Self-pay | Admitting: Family Medicine

## 2024-08-30 ENCOUNTER — Encounter: Payer: Self-pay | Admitting: Family Medicine

## 2024-08-30 DIAGNOSIS — Z1231 Encounter for screening mammogram for malignant neoplasm of breast: Secondary | ICD-10-CM

## 2024-09-02 ENCOUNTER — Ambulatory Visit (HOSPITAL_BASED_OUTPATIENT_CLINIC_OR_DEPARTMENT_OTHER)
Admission: RE | Admit: 2024-09-02 | Discharge: 2024-09-02 | Disposition: A | Discharge status: Home / Self Care | Source: Ambulatory Visit | Attending: Family Medicine | Admitting: Family Medicine

## 2024-09-02 DIAGNOSIS — R1022 Pelvic and perineal pain left side: Secondary | ICD-10-CM | POA: Insufficient documentation

## 2024-09-02 DIAGNOSIS — R319 Hematuria, unspecified: Secondary | ICD-10-CM | POA: Insufficient documentation

## 2024-09-05 ENCOUNTER — Encounter

## 2024-10-11 ENCOUNTER — Telehealth: Payer: Self-pay | Admitting: Family Medicine

## 2024-10-11 ENCOUNTER — Other Ambulatory Visit

## 2024-10-11 DIAGNOSIS — Z79899 Other long term (current) drug therapy: Secondary | ICD-10-CM | POA: Diagnosis not present

## 2024-10-11 DIAGNOSIS — E039 Hypothyroidism, unspecified: Secondary | ICD-10-CM

## 2024-10-11 LAB — TSH: TSH: 0.42 u[IU]/mL (ref 0.35–5.50)

## 2024-10-11 NOTE — Telephone Encounter (Signed)
 Patient has questions about her ultrasound from November. She would like a call back at [phone].

## 2024-10-11 NOTE — Telephone Encounter (Signed)
 Spoke with patient using interpreter ID [phone], patient stated she viewed her US  results in MyChart but it would not load so she wanted to know what it showed. Reviewed providers comments with patient, and provided her with contact information for OBGYN referral from October.

## 2024-10-13 ENCOUNTER — Ambulatory Visit: Payer: Self-pay | Admitting: Family Medicine

## 2025-03-08 ENCOUNTER — Ambulatory Visit: Payer: Self-pay | Admitting: Internal Medicine
